# Patient Record
Sex: Male | Born: 1985
Health system: Southern US, Community
[De-identification: ages and names within clinical notes are randomized; demographics above are authoritative.]

## PROBLEM LIST (undated history)

## (undated) DIAGNOSIS — F201 Disorganized schizophrenia: Secondary | ICD-10-CM

## (undated) DIAGNOSIS — F209 Schizophrenia, unspecified: Secondary | ICD-10-CM

## (undated) DIAGNOSIS — F319 Bipolar disorder, unspecified: Secondary | ICD-10-CM

## (undated) HISTORY — DX: Disorganized schizophrenia: F20.1

---

## 2012-09-27 ENCOUNTER — Emergency Department: Payer: Self-pay | Admitting: Emergency Medicine

## 2012-09-30 ENCOUNTER — Emergency Department: Payer: Self-pay | Admitting: Emergency Medicine

## 2012-10-04 ENCOUNTER — Emergency Department: Payer: Self-pay | Admitting: Emergency Medicine

## 2012-10-11 ENCOUNTER — Emergency Department: Payer: Self-pay | Admitting: Emergency Medicine

## 2017-02-09 DIAGNOSIS — F209 Schizophrenia, unspecified: Secondary | ICD-10-CM | POA: Insufficient documentation

## 2017-02-09 DIAGNOSIS — F79 Unspecified intellectual disabilities: Secondary | ICD-10-CM | POA: Insufficient documentation

## 2017-02-13 ENCOUNTER — Ambulatory Visit: Payer: Self-pay | Admitting: Family Medicine

## 2017-02-18 ENCOUNTER — Emergency Department
Admission: EM | Admit: 2017-02-18 | Discharge: 2017-02-18 | Disposition: A | Discharge status: Home / Self Care | Payer: Medicare Other | Attending: Emergency Medicine | Admitting: Emergency Medicine

## 2017-02-18 ENCOUNTER — Encounter: Payer: Self-pay | Admitting: Emergency Medicine

## 2017-02-18 ENCOUNTER — Emergency Department: Payer: Medicare Other

## 2017-02-18 DIAGNOSIS — F209 Schizophrenia, unspecified: Secondary | ICD-10-CM | POA: Diagnosis not present

## 2017-02-18 DIAGNOSIS — R55 Syncope and collapse: Secondary | ICD-10-CM | POA: Insufficient documentation

## 2017-02-18 HISTORY — DX: Schizophrenia, unspecified: F20.9

## 2017-02-18 HISTORY — DX: Bipolar disorder, unspecified: F31.9

## 2017-02-18 LAB — CBC
HEMATOCRIT: 44.1 % (ref 40.0–52.0)
HEMOGLOBIN: 14.7 g/dL (ref 13.0–18.0)
MCH: 29.4 pg (ref 26.0–34.0)
MCHC: 33.3 g/dL (ref 32.0–36.0)
MCV: 88.3 fL (ref 80.0–100.0)
Platelets: 182 10*3/uL (ref 150–440)
RBC: 5 MIL/uL (ref 4.40–5.90)
RDW: 12.3 % (ref 11.5–14.5)
WBC: 6.6 10*3/uL (ref 3.8–10.6)

## 2017-02-18 LAB — BASIC METABOLIC PANEL
Anion gap: 9 (ref 5–15)
BUN: 8 mg/dL (ref 6–20)
CALCIUM: 9.4 mg/dL (ref 8.9–10.3)
CO2: 26 mmol/L (ref 22–32)
CREATININE: 1.03 mg/dL (ref 0.61–1.24)
Chloride: 102 mmol/L (ref 101–111)
GFR calc non Af Amer: >60 mL/min (ref >60)
Glucose, Bld: 113 mg/dL — ABNORMAL HIGH (ref 65–99)
Potassium: 4 mmol/L (ref 3.5–5.1)
SODIUM: 137 mmol/L (ref 135–145)

## 2017-02-18 NOTE — Discharge Instructions (Signed)
Your workup in the Emergency Department today was reassuring.  We did not find any specific abnormalities.  You also told us that you think this might have happened because you were angry, and that might be true because we do not find any abnormal medical issues at this time.  We recommend you drink plenty of fluids, take your regular medications and/or any new ones prescribed today, and follow up with the doctor(s) listed in these documents as recommended.  Return to the Emergency Department if you develop new or worsening symptoms that concern you.

## 2017-02-18 NOTE — ED Notes (Signed)
Attempted to contact pt's legal guardian Thomas Forbes, DSS.  Per voicemail, he is out of the office until 9/10, unable to reach him on his cell phone, receive busy signal.    Office [phone] Cell [phone]

## 2017-02-18 NOTE — ED Notes (Signed)
Patient transported to X-ray 

## 2017-02-18 NOTE — ED Triage Notes (Signed)
Pt in via [facility] from group home due to syncopal episode.  Pt alert upon arrival with some delayed responses to verbal stimuli.  Pt states, "I wasn't feeling well and I passed out."  Pt denies any further complaints.  Pt with low grade fever 99.9 upon arrival.  Cough noted; pt unable to tell me how long cough has been going on.  NAD noted at this time.

## 2017-02-18 NOTE — ED Provider Notes (Addendum)
[facility] Emergency Department Provider Note  ____________________________________________   First MD Initiated Contact with Patient 02/18/17 2045     (approximate)  I have reviewed the triage vital signs and the nursing notes.   HISTORY  Chief Complaint Loss of Consciousness  Level 5 caveat:  history/ROS limited by chronic psychiatric illness.  The patient has a legal guardian.   HPI Thomas Forbes is a 31 y.o. male with chronic psychiatric illness who presents by EMS after possibly passing out or at least being found on the floor.  Initially the patient would not communicate or provide any additional history but his exam was inconsistent; he would not respond but he was tracking people in the room with eyes that were mostly closed, he would respond to painful stimuli, and he would divert his arm when it was raised above his face and dropped.  By the time I saw him he was completely back to baseline.  He states that he remembers passing out, but he states "I think I was just angry about some stuff and did not want to talk."  He denies chest pain, headache, neck pain, shortness of breath, nausea, and vomiting.He states he was angry before but is no longer angry and would like to go home.  He states he thinks this has happened to him before.   Past Medical History:  Diagnosis Date  . Bipolar 1 disorder (HCC)   . Schizophrenia (HCC)     There are no active problems to display for this patient.   History reviewed. No pertinent surgical history.  Prior to Admission medications   Not on File    Allergies Patient has no known allergies.  History reviewed. No pertinent family history.  Social History Social History  Substance Use Topics  . Smoking status: Never Smoker  . Smokeless tobacco: Never Used  . Alcohol use No    Review of Systems Level 5 caveat:  history/ROS limited by chronic psychiatric illness.  The patient has a legal  guardian.  Constitutional: No fever/chills Cardiovascular: Denies chest pain. Respiratory: Denies shortness of breath. Gastrointestinal: No abdominal pain.  No nausea, no vomiting.  No diarrhea.  No constipation. Genitourinary: Negative for dysuria. Musculoskeletal: Negative for neck pain.  Negative for back pain. Neurological: Negative for headaches, focal weakness or numbness.   ____________________________________________   PHYSICAL EXAM:  VITAL SIGNS: ED Triage Vitals  Enc Vitals Group     BP 02/18/17 1927 132/83     Pulse Rate 02/18/17 1927 (!) 111     Resp 02/18/17 1927 (!) 22     Temp 02/18/17 1927 99.9 F (37.7 C)     Temp Source 02/18/17 1927 Oral     SpO2 02/18/17 1927 97 %     Weight 02/18/17 1928 81 kg (178 lb 9.2 oz)     Height 02/18/17 1928 1.727 m ( )     Head Circumference --      Peak Flow --      Pain Score --      Pain Loc --      Pain Edu? --      Excl. in GC? --     Constitutional: Alert and oriented. Well appearing and in no acute distress. Eyes: Conjunctivae are normal.  Head: Atraumatic. Cardiovascular: Normal rate, regular rhythm. Good peripheral circulation. Grossly normal heart sounds. Respiratory: Normal respiratory effort.  No retractions. Lungs CTAB. Gastrointestinal: Soft and nontender. No distention.  Musculoskeletal: No lower extremity tenderness nor edema.  No gross deformities of extremities. Neurologic:  Normal speech and language. No gross focal neurologic deficits are appreciated.  Skin:  Skin is warm, dry and intact. No rash noted. Psychiatric: Mood and affect Seem to be at the patient's baseline based on prior documentation  ____________________________________________   LABS (all labs ordered are listed, but only abnormal results are displayed)  Labs Reviewed  BASIC METABOLIC PANEL - Abnormal; Notable for the following:       Result Value   Glucose, Bld 113 (*)    All other components within normal limits  CBC  CBG  MONITORING, ED   ____________________________________________  EKG  ED ECG REPORT I, Deacon Gadbois, the attending physician, personally viewed and interpreted this ECG.  Date: 02/18/2017 EKG Time: 19:16 Rate: 100 Rhythm: Borderline sinus tachycardia QRS Axis: normal Intervals: normal ST/T Wave abnormalities: Minimal ST elevation in multiple leads most consistent with early repolarization pattern less likely pericarditis Narrative Interpretation: no evidence of acute ischemia  ____________________________________________  RADIOLOGY   Dg Chest 2 View  Result Date: 02/18/2017 CLINICAL DATA:  Fever.  Cough of unknown duration.  Syncope. EXAM: CHEST  2 VIEW COMPARISON:  None. FINDINGS: The cardiomediastinal contours are normal. Mild central bronchitic changes. Pulmonary vasculature is normal. No consolidation, pleural effusion, or pneumothorax. No acute osseous abnormalities are seen. IMPRESSION: Mild central bronchitic changes. No consolidation to suggest pneumonia. Electronically Signed   By: Rubye Oaks M.D.   On: 02/18/2017 20:35   Ct Head Wo Contrast  Result Date: 02/18/2017 CLINICAL DATA:  31 year old male with history of syncope. EXAM: CT HEAD WITHOUT CONTRAST TECHNIQUE: Contiguous axial images were obtained from the base of the skull through the vertex without intravenous contrast. COMPARISON:  None. FINDINGS: Brain: No evidence of acute infarction, hemorrhage, hydrocephalus, extra-axial collection or mass lesion/mass effect. Vascular: No hyperdense vessel or unexpected calcification. Skull: Normal. Negative for fracture or focal lesion. Sinuses/Orbits: Mild multifocal mucosal thickening in the ethmoid sinuses bilaterally. No acute finding. Other: None. IMPRESSION: 1. No acute intracranial abnormalities. The appearance of the brain is normal. Electronically Signed   By: Trudie Reed M.D.   On: 02/18/2017 20:42     ____________________________________________   PROCEDURES  Critical Care performed: No   Procedure(s) performed:   Procedures   ____________________________________________   INITIAL IMPRESSION / ASSESSMENT AND PLAN / ED COURSE  Pertinent labs & imaging results that were available during my care of the patient were reviewed by me and considered in my medical decision making (see chart for details).  The patient initially had some slightly abnormal vital signs, but his vital signs are normal and stable when I evaluated him.  He is awake, alert, and appropriate.  He states he thinks this was all because he was angry, and I can find no acute or emergent medical condition.  Initially he had a very slightly elevated temperature and a very slightly elevated heart rate, but this could all be consistent with a viral syndrome, and he has had a thorough medical workup including labs, CT scan of his head, and chest x-rays, all of which are normal.  He reports no urinary symptoms, and in the absence of any dysuria do not see that it is necessary to check a urinalysis.  I will discharge him for outpatient follow-up.  He wants to go home and I think this is appropriate.   I gave my usual and customary return precautions.        ____________________________________________  FINAL CLINICAL IMPRESSION(S) / ED DIAGNOSES  Final diagnoses:  Syncope, unspecified syncope type     MEDICATIONS GIVEN DURING THIS VISIT:  Medications - No data to display   NEW OUTPATIENT MEDICATIONS STARTED DURING THIS VISIT:  New Prescriptions   No medications on file    Modified Medications   No medications on file    Discontinued Medications   No medications on file     Note:  This document was prepared using Dragon voice recognition software and may include unintentional dictation errors.    Loleta RoseForbach, Antavius Sperbeck, MD 02/18/17 2126    Loleta RoseForbach, Abrea Henle, MD 02/18/17 2128

## 2017-02-18 NOTE — ED Notes (Signed)
Pt unable to sign for discharge due to guardianship; see previous note.  Pt transported back to [facility] at this time via group home employee.

## 2017-02-18 NOTE — ED Notes (Signed)
This RN spoke with DSS after hours in regards to discharging pt back to [facility].  Cordelia PenAvi Williams, DSS is aware and states that she will relay information back to his legal guardian.

## 2017-02-18 NOTE — ED Notes (Addendum)
Report called to care taker of [facility], Laurian BrimChris Jennings.  Group Home agrees to provide transportation back to facility, states they will be here in approximately 20 minutes.  Pt will remain in room until transportation arrives.

## 2017-02-18 NOTE — ED Notes (Signed)
Patient transported to CT 

## 2017-03-26 ENCOUNTER — Emergency Department: Payer: Medicare Other

## 2017-03-26 ENCOUNTER — Emergency Department
Admission: EM | Admit: 2017-03-26 | Discharge: 2017-03-26 | Disposition: A | Discharge status: Home / Self Care | Payer: Medicare Other | Attending: Emergency Medicine | Admitting: Emergency Medicine

## 2017-03-26 DIAGNOSIS — F209 Schizophrenia, unspecified: Secondary | ICD-10-CM | POA: Diagnosis not present

## 2017-03-26 DIAGNOSIS — F319 Bipolar disorder, unspecified: Secondary | ICD-10-CM | POA: Insufficient documentation

## 2017-03-26 DIAGNOSIS — R4182 Altered mental status, unspecified: Secondary | ICD-10-CM | POA: Diagnosis present

## 2017-03-26 DIAGNOSIS — R42 Dizziness and giddiness: Secondary | ICD-10-CM | POA: Diagnosis not present

## 2017-03-26 LAB — URINALYSIS, COMPLETE (UACMP) WITH MICROSCOPIC
Bacteria, UA: NONE SEEN
Bilirubin Urine: NEGATIVE
GLUCOSE, UA: NEGATIVE mg/dL
HGB URINE DIPSTICK: NEGATIVE
KETONES UR: NEGATIVE mg/dL
LEUKOCYTES UA: NEGATIVE
Nitrite: NEGATIVE
PROTEIN: NEGATIVE mg/dL
RBC / HPF: NONE SEEN RBC/hpf (ref 0–5)
Specific Gravity, Urine: 1.005 (ref 1.005–1.030)
Squamous Epithelial / LPF: NONE SEEN
WBC UA: NONE SEEN WBC/hpf (ref 0–5)
pH: 7 (ref 5.0–8.0)

## 2017-03-26 LAB — BASIC METABOLIC PANEL
ANION GAP: 10 (ref 5–15)
BUN: 8 mg/dL (ref 6–20)
CALCIUM: 9.8 mg/dL (ref 8.9–10.3)
CHLORIDE: 106 mmol/L (ref 101–111)
CO2: 25 mmol/L (ref 22–32)
CREATININE: 0.97 mg/dL (ref 0.61–1.24)
GFR calc non Af Amer: >60 mL/min (ref >60)
Glucose, Bld: 118 mg/dL — ABNORMAL HIGH (ref 65–99)
Potassium: 3.8 mmol/L (ref 3.5–5.1)
SODIUM: 141 mmol/L (ref 135–145)

## 2017-03-26 LAB — URINE DRUG SCREEN, QUALITATIVE (ARMC ONLY)
Amphetamines, Ur Screen: NOT DETECTED
BARBITURATES, UR SCREEN: NOT DETECTED
BENZODIAZEPINE, UR SCRN: NOT DETECTED
CANNABINOID 50 NG, UR ~~LOC~~: NOT DETECTED
Cocaine Metabolite,Ur ~~LOC~~: NOT DETECTED
MDMA (Ecstasy)Ur Screen: NOT DETECTED
Methadone Scn, Ur: NOT DETECTED
OPIATE, UR SCREEN: NOT DETECTED
PHENCYCLIDINE (PCP) UR S: NOT DETECTED
Tricyclic, Ur Screen: NOT DETECTED

## 2017-03-26 LAB — CBC
HCT: 43.4 % (ref 40.0–52.0)
HEMOGLOBIN: 14.1 g/dL (ref 13.0–18.0)
MCH: 28.9 pg (ref 26.0–34.0)
MCHC: 32.5 g/dL (ref 32.0–36.0)
MCV: 88.8 fL (ref 80.0–100.0)
PLATELETS: 199 10*3/uL (ref 150–440)
RBC: 4.89 MIL/uL (ref 4.40–5.90)
RDW: 12.5 % (ref 11.5–14.5)
WBC: 4.2 10*3/uL (ref 3.8–10.6)

## 2017-03-26 LAB — GLUCOSE, CAPILLARY: GLUCOSE-CAPILLARY: 116 mg/dL — AB (ref 65–99)

## 2017-03-26 LAB — SALICYLATE LEVEL: Salicylate Lvl: <7 mg/dL (ref 2.8–30.0)

## 2017-03-26 LAB — ETHANOL

## 2017-03-26 LAB — ACETAMINOPHEN LEVEL: Acetaminophen (Tylenol), Serum: <10 ug/mL — ABNORMAL LOW (ref 10–30)

## 2017-03-26 MED ORDER — DIPHENHYDRAMINE HCL 25 MG PO CAPS
25.0000 mg | ORAL_CAPSULE | Freq: Once | ORAL | Status: AC
  Administered 2017-03-26: 25 mg via ORAL
  Filled 2017-03-26: qty 1

## 2017-03-26 MED ORDER — ONDANSETRON 4 MG PO TBDP
4.0000 mg | ORAL_TABLET | Freq: Once | ORAL | Status: AC
  Administered 2017-03-26: 4 mg via ORAL
  Filled 2017-03-26: qty 1

## 2017-03-26 NOTE — ED Notes (Signed)
Tora Perches Emergency planning/management officer) of [facility] on [address] notified of discharge. Pending transport.

## 2017-03-26 NOTE — ED Notes (Signed)
Legal guardian is Warehouse manager, [place]; Pt is in group home. Pt states he has not had any drugs or alcohol today, but had a cigarette. Caregiver states that he has never smoked before, but he states he smoked at his previous residence (4 or 5 years ago). Caregiver states that he stated he told her he was unable to open the car door. Pt states that he was feeling dizzy and did not feel he could open the door but he is no longer dizzy.

## 2017-03-26 NOTE — ED Provider Notes (Signed)
[facility] Emergency Department Provider Note  ____________________________________________   First MD Initiated Contact with Patient 03/26/17 1718     (approximate)  I have reviewed the triage vital signs and the nursing notes.   HISTORY  Chief Complaint Weakness   HPI Deray Pyon is a 31 y.o. male with a history of bipolar disorder as well as schizophrenia on monthly Haldol injections was presenting to the emergency department today with an episode of altered mentation. He is with his caretaker who knows him well. She says that this afternoon here. Of time where he was uncooperative and refused to walk and open the door of their car. She said that this is very unusual for him. She also notes that he is blinking his eyes more than usual. The patient was also feeling slightly lightheaded and with a frontal headache which haven't all now resolved. He has no complete at this time. The caretaker is concerned about him using drugs at his day program. The patient admits to smoking cigarettes but denies any drug use.   Past Medical History:  Diagnosis Date  . Bipolar 1 disorder (HCC)   . Schizophrenia (HCC)     There are no active problems to display for this patient.   History reviewed. No pertinent surgical history.  Prior to Admission medications   Not on File    Allergies Patient has no known allergies.  No family history on file.  Social History Social History  Substance Use Topics  . Smoking status: Never Smoker  . Smokeless tobacco: Never Used  . Alcohol use No    Review of Systems  Constitutional: No fever/chills Eyes: No visual changes. ENT: No sore throat. Cardiovascular: Denies chest pain. Respiratory: Denies shortness of breath. Gastrointestinal: No abdominal pain.  No nausea, no vomiting.  No diarrhea.  No constipation. Genitourinary: Negative for dysuria. Musculoskeletal: Negative for back pain. Skin: Negative for  rash. Neurological: Negative for focal weakness or numbness.   ____________________________________________   PHYSICAL EXAM:  VITAL SIGNS: ED Triage Vitals  Enc Vitals Group     BP 03/26/17 1536 107/68     Pulse Rate 03/26/17 1536 (!) 106     Resp 03/26/17 1536 20     Temp 03/26/17 1536 99 F (37.2 C)     Temp Source 03/26/17 1536 Oral     SpO2 03/26/17 1536 98 %     Weight 03/26/17 1537 180 lb (81.6 kg)     Height --      Head Circumference --      Peak Flow --      Pain Score --      Pain Loc --      Pain Edu? --      Excl. in GC? --     Constitutional: Alert and oriented. Well appearing and in no acute distress. Eyes: Conjunctivae are normal. intermittent blinking of the eyes. Head: Atraumatic. Nose: No congestion/rhinnorhea. Mouth/Throat: Mucous membranes are moist.  Neck: No stridor.   Cardiovascular: Normal rate, regular rhythm. Grossly normal heart sounds.  heart rate 83 bpm in the room. Respiratory: Normal respiratory effort.  No retractions. Lungs CTAB. Gastrointestinal: Soft and nontender. No distention.  Musculoskeletal: No lower extremity tenderness nor edema.  No joint effusions. Neurologic:  Normal speech and language. No gross focal neurologic deficits are appreciated. Skin:  Skin is warm, dry and intact. No rash noted. Psychiatric: Mood and affect are normal. Speech and behavior are normal.  ____________________________________________   LABS (all  labs ordered are listed, but only abnormal results are displayed)  Labs Reviewed  BASIC METABOLIC PANEL - Abnormal; Notable for the following:       Result Value   Glucose, Bld 118 (*)    All other components within normal limits  URINALYSIS, COMPLETE (UACMP) WITH MICROSCOPIC - Abnormal; Notable for the following:    Color, Urine STRAW (*)    APPearance CLEAR (*)    All other components within normal limits  GLUCOSE, CAPILLARY - Abnormal; Notable for the following:    Glucose-Capillary 116 (*)     All other components within normal limits  ACETAMINOPHEN LEVEL - Abnormal; Notable for the following:    Acetaminophen (Tylenol), Serum <10 (*)    All other components within normal limits  CBC  URINE DRUG SCREEN, QUALITATIVE ([facility] ONLY)  ETHANOL  SALICYLATE LEVEL  CBG MONITORING, ED   ____________________________________________  EKG  ED ECG REPORT I, Arelia Longest, the attending physician, personally viewed and interpreted this ECG.   Date: 03/26/2017  EKG Time: 1541  Rate: 105  Rhythm: sinus tachycardia  Axis: normal  Intervals:none  ST&T Change: no ST segment elevation or depression. No abnormal T-wave inversion.  ____________________________________________  RADIOLOGY  no acute finding on head CT. ____________________________________________   PROCEDURES  Procedure(s) performed:   Procedures  Critical Care performed:   ____________________________________________   INITIAL IMPRESSION / ASSESSMENT AND PLAN / ED COURSE  Pertinent labs & imaging results that were available during my care of the patient were reviewed by me and considered in my medical decision making (see chart for details).  Differential diagnosis includes, but is not limited to, alcohol, illicit or prescription medications, or other toxic ingestion; intracranial pathology such as stroke or intracerebral hemorrhage; fever or infectious causes including sepsis; hypoxemia and/or hypercarbia; uremia; trauma; endocrine related disorders such as diabetes, hypoglycemia, and thyroid-related diseases; hypertensive encephalopathy; etc.  As part of my medical decision making, I reviewed the following data within the electronic MEDICAL RECORD NUMBER Notes from prior ED visits  possible dystonic reaction to Haldol. Benadryl ordered. We will reassess. Patient has an appointment with his psychiatrist this coming Wednesday.    ----------------------------------------- 7:14 PM on  03/26/2017 -----------------------------------------  Patient with very reassuring lab results. No complaints at this time. Still with flickering of his eyelids. Unclear cause of this. However, the patient has no myoclonic jerking. Otherwise nonfocal neurologic exam. Examined for clonus at this time which is negative. No hyperreflexia. Patient to follow-up with his psychiatrist this Wednesday for possible medication changes. Patient to be discharged with group home are presented.  ____________________________________________   FINAL CLINICAL IMPRESSION(S) / ED DIAGNOSES  altered mental status, resolved.    NEW MEDICATIONS STARTED DURING THIS VISIT:  New Prescriptions   No medications on file     Note:  This document was prepared using Dragon voice recognition software and may include unintentional dictation errors.     Myrna Blazer, MD 03/26/17 [phone]

## 2017-03-26 NOTE — ED Notes (Signed)
Questionable substance use at day program. Pt denies. Caregiver suspicious if patient used mariajuana while there. No guardian listed, pt cannot give phone number of legal guardian. Listed is group home owner number, pt with staff of group home.

## 2017-03-26 NOTE — ED Notes (Signed)
Left VM with Billy Fischer (legal guardian) to return phone call for discharge information.

## 2017-03-26 NOTE — ED Triage Notes (Signed)
Pt arrives to ER via POV c/o episode of fatigue, weakness and "shakiness". Brought in with caregiver. Pt alert and oriented X4, active, cooperative, pt in NAD. RR even and unlabored, color WNL.

## 2017-03-26 NOTE — ED Notes (Addendum)
Pt. And caregiver from [facility] Verbalize understanding of d/c instructions and follow-up. Message left for guardian. VS stable and pain controlled per pt.  Pt. In NAD at time of d/c and denies further concerns regarding this visit. Pt. Stable at the time of departure from the unit, departing unit by the safest and most appropriate manner per that pt condition and limitations with all belongings accounted for. Pt advised to return to the ED at any time for emergent concerns, or for new/worsening symptoms.

## 2017-05-10 ENCOUNTER — Emergency Department
Admission: EM | Admit: 2017-05-10 | Discharge: 2017-05-11 | Disposition: A | Discharge status: Home / Self Care | Payer: Medicare Other | Attending: Emergency Medicine | Admitting: Emergency Medicine

## 2017-05-10 ENCOUNTER — Other Ambulatory Visit: Payer: Self-pay

## 2017-05-10 DIAGNOSIS — F209 Schizophrenia, unspecified: Secondary | ICD-10-CM | POA: Diagnosis not present

## 2017-05-10 DIAGNOSIS — R4689 Other symptoms and signs involving appearance and behavior: Secondary | ICD-10-CM | POA: Diagnosis present

## 2017-05-10 LAB — URINE DRUG SCREEN, QUALITATIVE (ARMC ONLY)
Amphetamines, Ur Screen: NOT DETECTED
BENZODIAZEPINE, UR SCRN: NOT DETECTED
Barbiturates, Ur Screen: NOT DETECTED
CANNABINOID 50 NG, UR ~~LOC~~: NOT DETECTED
Cocaine Metabolite,Ur ~~LOC~~: NOT DETECTED
MDMA (ECSTASY) UR SCREEN: NOT DETECTED
Methadone Scn, Ur: NOT DETECTED
OPIATE, UR SCREEN: NOT DETECTED
PHENCYCLIDINE (PCP) UR S: NOT DETECTED
Tricyclic, Ur Screen: NOT DETECTED

## 2017-05-10 LAB — CBC
HCT: 45.5 % (ref 40.0–52.0)
Hemoglobin: 15 g/dL (ref 13.0–18.0)
MCH: 28.6 pg (ref 26.0–34.0)
MCHC: 32.9 g/dL (ref 32.0–36.0)
MCV: 87.1 fL (ref 80.0–100.0)
PLATELETS: 212 10*3/uL (ref 150–440)
RBC: 5.22 MIL/uL (ref 4.40–5.90)
RDW: 12.4 % (ref 11.5–14.5)
WBC: 5.2 10*3/uL (ref 3.8–10.6)

## 2017-05-10 LAB — SALICYLATE LEVEL: Salicylate Lvl: <7 mg/dL (ref 2.8–30.0)

## 2017-05-10 LAB — COMPREHENSIVE METABOLIC PANEL
ALK PHOS: 66 U/L (ref 38–126)
ALT: 28 U/L (ref 17–63)
AST: 23 U/L (ref 15–41)
Albumin: 4.4 g/dL (ref 3.5–5.0)
Anion gap: 7 (ref 5–15)
BILIRUBIN TOTAL: 0.6 mg/dL (ref 0.3–1.2)
BUN: 10 mg/dL (ref 6–20)
CO2: 24 mmol/L (ref 22–32)
Calcium: 9.5 mg/dL (ref 8.9–10.3)
Chloride: 106 mmol/L (ref 101–111)
Creatinine, Ser: 0.76 mg/dL (ref 0.61–1.24)
GFR calc Af Amer: >60 mL/min (ref >60)
Glucose, Bld: 122 mg/dL — ABNORMAL HIGH (ref 65–99)
Potassium: 3.8 mmol/L (ref 3.5–5.1)
Sodium: 137 mmol/L (ref 135–145)
TOTAL PROTEIN: 7.3 g/dL (ref 6.5–8.1)

## 2017-05-10 LAB — ETHANOL

## 2017-05-10 LAB — ACETAMINOPHEN LEVEL: Acetaminophen (Tylenol), Serum: <10 ug/mL — ABNORMAL LOW (ref 10–30)

## 2017-05-10 MED ORDER — BENZTROPINE MESYLATE 1 MG PO TABS
2.0000 mg | ORAL_TABLET | Freq: Every day | ORAL | Status: DC
  Administered 2017-05-10: 2 mg via ORAL

## 2017-05-10 MED ORDER — RISPERIDONE 1 MG PO TABS: 1.0000 mg | ORAL_TABLET | Freq: Every day | ORAL | Status: DC

## 2017-05-10 MED ORDER — LORAZEPAM 1 MG PO TABS: 1.0000 mg | ORAL_TABLET | Freq: Every morning | ORAL | Status: DC

## 2017-05-10 MED ORDER — RISPERIDONE 3 MG PO TABS
4.0000 mg | ORAL_TABLET | Freq: Once | ORAL | Status: AC
  Administered 2017-05-10: 4 mg via ORAL
  Filled 2017-05-10: qty 1

## 2017-05-10 MED ORDER — BENZTROPINE MESYLATE 1 MG PO TABS
ORAL_TABLET | ORAL | Status: AC
  Filled 2017-05-10: qty 2

## 2017-05-10 MED ORDER — BENZTROPINE MESYLATE 1 MG/ML IJ SOLN
2.0000 mg | Freq: Every day | INTRAMUSCULAR | Status: DC
  Filled 2017-05-10: qty 2

## 2017-05-10 NOTE — ED Notes (Signed)
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
## 2017-05-10 NOTE — ED Notes (Signed)
Pt. To [facility] from ED ambulatory without difficulty, to room  . Report from RN. Pt. Is alert and oriented, warm and dry in no distress. Pt. Denies SI, HI, and AVH. Pt. Calm and cooperative. Pt. Made aware of security cameras and Q15 minute rounds. Pt. Encouraged to let Nursing staff know of any concerns or needs.   
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
## 2017-05-11 NOTE — ED Notes (Signed)
[phone] Conan BowensKenneth Bragg patient's guardian. He would like a call back from EDP.

## 2017-05-11 NOTE — ED Notes (Signed)
Group home made aware of patient discharge. Manager of group home states will be here in 30 mins for pick up.

## 2017-05-11 NOTE — ED Notes (Signed)
Patient alert and oriented. Patient denies SI/HI/AVH and pain. Patient aware of discharge to group home and is accepting.

## 2017-05-11 NOTE — ED Notes (Signed)
Pt discharged to group home. D/C instructions reviewed with patient and given to group home representative. All belongings returned to patient. Pt denies SI/HI and AVH.

## 2017-05-11 NOTE — ED Notes (Signed)
SOC recommends discharge. 

## 2017-05-11 NOTE — ED Notes (Signed)
Paperwork received stating Conan BowensKenneth Bragg is Pt's guardian. Placing faxed paperwork in paper chart.

## 2017-05-11 NOTE — ED Notes (Signed)
Report given to SOC 

## 2017-05-11 NOTE — ED Notes (Addendum)
Guardian Thomas Forbes(Thomas Forbes)called wanting fax number to fax over guardianship paperwork. Fax number gave.

## 2017-10-18 ENCOUNTER — Other Ambulatory Visit: Payer: Self-pay

## 2017-10-18 ENCOUNTER — Ambulatory Visit (INDEPENDENT_AMBULATORY_CARE_PROVIDER_SITE_OTHER): Payer: Medicare Other | Admitting: Nurse Practitioner

## 2017-10-18 ENCOUNTER — Encounter: Payer: Self-pay | Admitting: Nurse Practitioner

## 2017-10-18 VITALS — BP 115/52 | HR 82 | Temp 98.2°F | Ht 66.5 in | Wt 200.0 lb

## 2017-10-18 DIAGNOSIS — Z7689 Persons encountering health services in other specified circumstances: Secondary | ICD-10-CM | POA: Diagnosis not present

## 2017-10-18 DIAGNOSIS — Z6831 Body mass index (BMI) 31.0-31.9, adult: Secondary | ICD-10-CM

## 2017-10-18 DIAGNOSIS — E661 Drug-induced obesity: Secondary | ICD-10-CM

## 2017-10-18 DIAGNOSIS — F79 Unspecified intellectual disabilities: Secondary | ICD-10-CM | POA: Diagnosis not present

## 2017-10-18 DIAGNOSIS — F201 Disorganized schizophrenia: Secondary | ICD-10-CM

## 2017-10-18 NOTE — Progress Notes (Signed)
Subjective:    Patient ID: Thomas Forbes, male    DOB: 16-Feb-1986, 32 y.o.   MRN: [id number]  Thomas Forbes is a 32 y.o. male presenting on 10/18/2017 for Establish Care   HPI Establish Care New Provider Pt last seen by PCP several months ago.  Obtain records from Dr. Martie Round office.  Care is being transitioned 2/2 request from guardian as they could not get records from his prior PCP.  Guardianship is held by [facility] Conan Bowens, M.Ed).  Pt is resident of group home x 8 years (started in 2011).  Home is lead and managed by Birder Robson.  His care assistant is Tora Perches, who is accompanying patient today.  Jay Schlichter is also a designated party for release of information.    Bipolar 1 Disorder/Schizophrenia: Pt is managed by Dr. Janeece Riggers in [facility] with [facility].  He has had very few behavioral outbursts in recent weeks without need for prn ativan or haldol administration on last 30 day MAR record.  Pt admits he is currently functioning well on medications as prescribed.  Obesity - Weight gain over the winter with more sedentary lifestyle.  Pt and caregiver admit pt will become much more active in near future with more outdoor activity in summer.  Pt is not preparer of his own meals.  Usually has good appetite.  Past Medical History:  Diagnosis Date  . Bipolar 1 disorder (HCC)   . Chronic disorganized schizophrenia (HCC)   . Schizophrenia (HCC)    No past surgical history on file. Social History   Socioeconomic History  . Marital status: Single    Spouse name: Not on file  . Number of children: Not on file  . Years of education: Not on file  . Highest education level: 10th grade  Occupational History  . Not on file  Social Needs  . Financial resource strain: Not on file  . Food insecurity:    Worry: Not on file    Inability: Not on file  . Transportation needs:    Medical: Not on file    Non-medical: Not on file  Tobacco Use   . Smoking status: Former Smoker    Types: Cigarettes    Last attempt to quit: 08/18/2017    Years since quitting: 0.2  . Smokeless tobacco: Never Used  . Tobacco comment: intermittent cigarettes - smoked from friends  Substance and Sexual Activity  . Alcohol use: No  . Drug use: No  . Sexual activity: Not on file  Lifestyle  . Physical activity:    Days per week: Not on file    Minutes per session: Not on file  . Stress: Not on file  Relationships  . Social connections:    Talks on phone: Not on file    Gets together: Not on file    Attends religious service: Not on file    Active member of club or organization: Not on file    Attends meetings of clubs or organizations: Not on file    Relationship status: Not on file  . Intimate partner violence:    Fear of current or ex partner: Not on file    Emotionally abused: Not on file    Physically abused: Not on file    Forced sexual activity: Not on file  Other Topics Concern  . Not on file  Social History Narrative  . Not on file   No family history on file. Current Outpatient Medications on File Prior  to Visit  Medication Sig  . benztropine (COGENTIN) 2 MG tablet Take 2 mg by mouth at bedtime.  . Cholecalciferol (VITAMIN D) 2000 units tablet Take 2,000 Units by mouth daily.  . haloperidol (HALDOL) 2 MG tablet Take 1 mg by mouth daily as needed for agitation.  . haloperidol decanoate (HALDOL DECANOATE) 100 MG/ML injection 250 mg every 28 (twenty-eight) days.   Marland Kitchen LORazepam (ATIVAN) 1 MG tablet Take 1 mg by mouth daily as needed for anxiety.  . risperiDONE (RISPERDAL) 2 MG tablet Take 2 mg by mouth daily.  . risperidone (RISPERDAL) 4 MG tablet Take 4 mg by mouth at bedtime.   No current facility-administered medications on file prior to visit.     Review of Systems  Constitutional: Negative.  Negative for unexpected weight change.  HENT: Negative.   Eyes: Negative.   Respiratory: Negative.   Cardiovascular: Negative.     Gastrointestinal: Negative.   Endocrine: Negative.   Genitourinary: Negative.   Musculoskeletal: Negative.   Skin: Negative.   Allergic/Immunologic: Negative.   Neurological: Negative.   Hematological: Negative.   Psychiatric/Behavioral: Negative.  Negative for behavioral problems, decreased concentration, dysphoric mood and self-injury.       Objective:    BP (!) 115/52 (BP Location: Right Arm, Patient Position: Sitting, Cuff Size: Normal)   Pulse 82   Temp 98.2 F (36.8 C) (Oral)   Ht 5' 6.5" (1.689 m)   Wt 200 lb (90.7 kg)   BMI 31.80 kg/m    Wt Readings from Last 3 Encounters:  10/18/17 200 lb (90.7 kg)  05/10/17 180 lb (81.6 kg)  03/26/17 180 lb (81.6 kg)    Physical Exam  Constitutional: He is oriented to person, place, and time. He appears well-developed and well-nourished. No distress.  HENT:  Head: Normocephalic and atraumatic.  Right Ear: External ear normal.  Left Ear: External ear normal.  Nose: Nose normal.  Mouth/Throat: Oropharynx is clear and moist.  Eyes: Pupils are equal, round, and reactive to light. Conjunctivae are normal.  Neck: Normal range of motion. Neck supple. No JVD present. No tracheal deviation present. No thyromegaly present.  Cardiovascular: Normal rate, regular rhythm, normal heart sounds and intact distal pulses. Exam reveals no gallop and no friction rub.  No murmur heard. Pulmonary/Chest: Effort normal and breath sounds normal. No respiratory distress.  Abdominal: Soft. Bowel sounds are normal. He exhibits no distension. There is no tenderness.  Musculoskeletal: Normal range of motion.  Lymphadenopathy:    He has no cervical adenopathy.  Neurological: He is alert and oriented to person, place, and time. No cranial nerve deficit.  Skin: Skin is warm and dry. Capillary refill takes less than 2 seconds.  Psychiatric: He has a normal mood and affect. Thought content normal. His affect is not inappropriate. He is hyperactive (fidgeting  with hands) and withdrawn. He is not agitated and not aggressive. Cognition and memory are not impaired. He expresses impulsivity and inappropriate judgment (unable to make own decisions for medical care). He is noncommunicative (Pt is not highly communicative, but shares most required information.  Slow to trust new people.).  Pt is well groomed with clean clothing and good personal hygiene.  Nursing note and vitals reviewed.  Results for orders placed or performed during the hospital encounter of 05/10/17  Comprehensive metabolic panel  Result Value Ref Range   Sodium 137 135 - 145 mmol/L   Potassium 3.8 3.5 - 5.1 mmol/L   Chloride 106 101 - 111 mmol/L   CO2  24 22 - 32 mmol/L   Glucose, Bld 122 (H) 65 - 99 mg/dL   BUN 10 6 - 20 mg/dL   Creatinine, Ser 8.46 0.61 - 1.24 mg/dL   Calcium 9.5 8.9 - 96.2 mg/dL   Total Protein 7.3 6.5 - 8.1 g/dL   Albumin 4.4 3.5 - 5.0 g/dL   AST 23 15 - 41 U/L   ALT 28 17 - 63 U/L   Alkaline Phosphatase 66 38 - 126 U/L   Total Bilirubin 0.6 0.3 - 1.2 mg/dL   GFR calc non Af Amer >60 >60 mL/min   GFR calc Af Amer >60 >60 mL/min   Anion gap 7 5 - 15  Ethanol  Result Value Ref Range   Alcohol, Ethyl (B) <10 <10 mg/dL  Salicylate level  Result Value Ref Range   Salicylate Lvl <7.0 2.8 - 30.0 mg/dL  Acetaminophen level  Result Value Ref Range   Acetaminophen (Tylenol), Serum <10 (L) 10 - 30 ug/mL  cbc  Result Value Ref Range   WBC 5.2 3.8 - 10.6 K/uL   RBC 5.22 4.40 - 5.90 MIL/uL   Hemoglobin 15.0 13.0 - 18.0 g/dL   HCT 95.2 84.1 - 32.4 %   MCV 87.1 80.0 - 100.0 fL   MCH 28.6 26.0 - 34.0 pg   MCHC 32.9 32.0 - 36.0 g/dL   RDW 40.1 02.7 - 25.3 %   Platelets 212 150 - 440 K/uL  Urine Drug Screen, Qualitative  Result Value Ref Range   Tricyclic, Ur Screen NONE DETECTED NONE DETECTED   Amphetamines, Ur Screen NONE DETECTED NONE DETECTED   MDMA (Ecstasy)Ur Screen NONE DETECTED NONE DETECTED   Cocaine Metabolite,Ur [address] NONE DETECTED NONE DETECTED    Opiate, Ur Screen NONE DETECTED NONE DETECTED   Phencyclidine (PCP) Ur S NONE DETECTED NONE DETECTED   Cannabinoid 50 Ng, Ur [place] NONE DETECTED NONE DETECTED   Barbiturates, Ur Screen NONE DETECTED NONE DETECTED   Benzodiazepine, Ur Scrn NONE DETECTED NONE DETECTED   Methadone Scn, Ur NONE DETECTED NONE DETECTED      Assessment & Plan:   Problem List Items Addressed This Visit      Other   Intellectual disability Pt with decreased ability to learn.  No high school diploma, but completed special education certificate.  He is a ward of the state with guardian in [place] DSS.  Continue care in group home.  FL2 up to date currently and is due for review at annual physical.    Schizophrenia (HCC)  Currently stable and managed by Dr. Janeece Riggers in [place].  Continue followup as scheduled.    Class 1 drug-induced obesity without serious comorbidity with body mass index (BMI) of 31.0 to 31.9 in adult  -  Primary Likely psychotropic medications are contributing to weight gain.  Pt has fluctuations of weight with increased sedentary lifestyle during winter.  Eats regular diet.   Plan: encouraged increasing physical activity and continuing activity through winter as able.    Other Visit Diagnoses    Encounter to establish care    Previous PCP was at Dr. Glenis Smoker.  Records will be requested.  Past medical, family, and surgical history reviewed w/ pt.        No orders of the defined types were placed in this encounter.    Follow up plan: Return in about 6 months (around 04/20/2018) for annual physical and FL2 renewal.  Wilhelmina Mcardle, DNP, AGPCNP-BC Adult Gerontology Primary Care Nurse Practitioner [facility]  [facility] 11/05/2017, 10:07 PM

## 2017-10-18 NOTE — Patient Instructions (Addendum)
Thomas Forbes,   Thank you for coming in to clinic today.  1. Keep your regular followup with Dr. Janeece Riggers  2. Let us know if you need [place] before November.  Please schedule a follow-up appointment with Wilhelmina Mcardle, AGNP. Return in about 6 months (around 04/20/2018) for annual physical and FL2 renewal.  If you have any other questions or concerns, please feel free to call the clinic or send a message through MyChart. You may also schedule an earlier appointment if necessary.  You will receive a survey after today's visit either digitally by e-mail or paper by [employer]. Your experiences and feedback matter to [place].  Please respond so we know how we are doing as we provide care for you.   Wilhelmina Mcardle, DNP, AGNP-BC Adult Gerontology Nurse Practitioner [facility], [facility]

## 2017-11-05 ENCOUNTER — Encounter: Payer: Self-pay | Admitting: Nurse Practitioner

## 2017-11-05 DIAGNOSIS — E661 Drug-induced obesity: Secondary | ICD-10-CM | POA: Insufficient documentation

## 2017-11-05 DIAGNOSIS — Z6831 Body mass index (BMI) 31.0-31.9, adult: Secondary | ICD-10-CM

## 2017-11-13 ENCOUNTER — Encounter (INDEPENDENT_AMBULATORY_CARE_PROVIDER_SITE_OTHER): Payer: Self-pay

## 2018-04-15 ENCOUNTER — Other Ambulatory Visit: Payer: Medicare Other

## 2018-04-18 ENCOUNTER — Encounter: Payer: Medicare Other | Admitting: Nurse Practitioner

## 2018-04-19 ENCOUNTER — Encounter: Payer: Medicare Other | Admitting: Nurse Practitioner

## 2018-05-21 ENCOUNTER — Ambulatory Visit (INDEPENDENT_AMBULATORY_CARE_PROVIDER_SITE_OTHER): Payer: Medicare Other | Admitting: Nurse Practitioner

## 2018-05-21 ENCOUNTER — Ambulatory Visit: Payer: Medicare Other | Admitting: Nurse Practitioner

## 2018-05-21 ENCOUNTER — Other Ambulatory Visit: Payer: Self-pay

## 2018-05-21 ENCOUNTER — Encounter: Payer: Self-pay | Admitting: Nurse Practitioner

## 2018-05-21 VITALS — BP 105/56 | HR 84 | Temp 98.4°F | Ht 66.5 in | Wt 208.2 lb

## 2018-05-21 DIAGNOSIS — Z79899 Other long term (current) drug therapy: Secondary | ICD-10-CM

## 2018-05-21 DIAGNOSIS — R635 Abnormal weight gain: Secondary | ICD-10-CM

## 2018-05-21 DIAGNOSIS — Z130 Encounter for screening for diseases of the blood and blood-forming organs and certain disorders involving the immune mechanism: Secondary | ICD-10-CM

## 2018-05-21 DIAGNOSIS — Z23 Encounter for immunization: Secondary | ICD-10-CM

## 2018-05-21 DIAGNOSIS — Z131 Encounter for screening for diabetes mellitus: Secondary | ICD-10-CM | POA: Diagnosis not present

## 2018-05-21 NOTE — Patient Instructions (Addendum)
Thomas Forbes,   Thank you for coming in to clinic today.  1. Work to cut back some of your desserts and avoid overeating to keep your weight near a healthy range.  2. You will be due for FASTING BLOOD WORK.  This means you should eat no food or drink after midnight.  Drink only water or coffee without cream/sugar on the morning of your lab visit. - Please go ahead and schedule a "Lab Only" visit in the morning at the clinic for lab draw in the next 7 days. - Your results will be available about 2-3 days after blood draw.  If you have set up a MyChart account, you can can log in to MyChart online to view your results and a brief explanation. Also, we can discuss your results together at your next office visit if you would like.  3. Verify you have had a flu shot.  If you have not gotten one this year.  You are due to get that when you come for labs.  If you have gotten it, please bring us your record.  Please schedule a follow-up appointment with Wilhelmina McardleLauren Baeleigh Devincent, AGNP. Return in about 1 year (around 05/22/2019) for annual physical.    If you have any other questions or concerns, please feel free to call the clinic or send a message through MyChart. You may also schedule an earlier appointment if necessary.  You will receive a survey after today's visit either digitally by e-mail or paper by [employer] mail. Your experiences and feedback matter to us.  Please respond so we know how we are doing as we provide care for you.   Wilhelmina McardleLauren Ivelis Norgard, DNP, AGNP-BC Adult Gerontology Nurse Practitioner [facility]

## 2018-05-21 NOTE — Progress Notes (Addendum)
Subjective:    Patient ID: Thomas Forbes, male    DOB: 02-May-1986, 32 y.o.   MRN: [id number]  Thomas Forbes is a 32 y.o. male presenting on 05/21/2018 for Annual Exam   HPI Yearly Medicare Physical Exam Patient has been feeling well.  They have no acute concerns today. Sleeps 8-9 hours per night uninterrupted. - Patient continues to reside in a group home.  FL2 has been completed since his last physical exam (November 2019) and unsure of which physician completed this. - Continues to get along regularly with housemates and generally has not had any behavioral outbursts.  Takes haldol seldomly for these outbursts.  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  Not as active during winter.  Is going to the park for walking.  Diet: generally healthy, no control over choices except. Seatbelt: always Sunscreen: not usually HIV: neg in past Optometry: not regularly Dentistry: at [employer] and [employer] regularly  VACCINES: Tetanus: due - agrees to get today Influenza: was given to him at [facility] - saw them 1 month ago per patient.  He was not able to verify this with any paperwork from his folder.  His caregiver today was also unable to verify this today, but will check with the other home caregivers.  Social History   Tobacco Use  . Smoking status: Former Smoker    Types: Cigarettes    Last attempt to quit: 08/18/2017    Years since quitting: 0.7  . Smokeless tobacco: Never Used  . Tobacco comment: intermittent cigarettes - smoked from friends  Substance Use Topics  . Alcohol use: No  . Drug use: No    Review of Systems Per HPI unless specifically indicated above     Objective:    BP (!) 105/56 (BP Location: Right Arm, Patient Position: Sitting, Cuff Size: Large)   Pulse 84   Temp 98.4 F (36.9 C) (Oral)   Ht 5' 6.5" (1.689 m)   Wt 208 lb 3.2 oz (94.4 kg)   BMI 33.10 kg/m   Wt  Readings from Last 3 Encounters:  05/21/18 208 lb 3.2 oz (94.4 kg)  10/18/17 200 lb (90.7 kg)  05/10/17 180 lb (81.6 kg)    Physical Exam  Constitutional: He is oriented to person, place, and time. He appears well-developed and well-nourished. No distress.  Obese with central adiposity  HENT:  Head: Normocephalic and atraumatic.  Right Ear: External ear normal.  Left Ear: External ear normal.  Nose: Nose normal.  Mouth/Throat: Oropharynx is clear and moist.  Eyes: Pupils are equal, round, and reactive to light. Conjunctivae are normal.  Neck: Normal range of motion. Neck supple. No JVD present. No tracheal deviation present. No thyromegaly present.  Cardiovascular: Normal rate, regular rhythm, normal heart sounds and intact distal pulses. Exam reveals no gallop and no friction rub.  No murmur heard. Pulmonary/Chest: Effort normal and breath sounds normal. No respiratory distress.  Abdominal: Soft. Bowel sounds are normal. He exhibits no distension. There is no hepatosplenomegaly. There is no tenderness.  Musculoskeletal: Normal range of motion.  Lymphadenopathy:    He has no cervical adenopathy.  Neurological: He is alert and oriented to person, place, and time. No cranial nerve deficit.  Skin: Skin is warm and dry. Capillary refill takes less than 2 seconds.  Psychiatric: He has a normal mood and affect. His behavior is normal. Judgment and thought content normal.  Nursing note and vitals reviewed.  Results for orders placed or performed during the hospital encounter of 05/10/17  Comprehensive metabolic panel  Result Value Ref Range   Sodium 137 135 - 145 mmol/L   Potassium 3.8 3.5 - 5.1 mmol/L   Chloride 106 101 - 111 mmol/L   CO2 24 22 - 32 mmol/L   Glucose, Bld 122 (H) 65 - 99 mg/dL   BUN 10 6 - 20 mg/dL   Creatinine, Ser 1.610.76 0.61 - 1.24 mg/dL   Calcium 9.5 8.9 - 09.610.3 mg/dL   Total Protein 7.3 6.5 - 8.1 g/dL   Albumin 4.4 3.5 - 5.0 g/dL   AST 23 15 - 41 U/L   ALT 28  17 - 63 U/L   Alkaline Phosphatase 66 38 - 126 U/L   Total Bilirubin 0.6 0.3 - 1.2 mg/dL   GFR calc non Af Amer >60 >60 mL/min   GFR calc Af Amer >60 >60 mL/min   Anion gap 7 5 - 15  Ethanol  Result Value Ref Range   Alcohol, Ethyl (B) <10 <10 mg/dL  Salicylate level  Result Value Ref Range   Salicylate Lvl <7.0 2.8 - 30.0 mg/dL  Acetaminophen level  Result Value Ref Range   Acetaminophen (Tylenol), Serum <10 (L) 10 - 30 ug/mL  cbc  Result Value Ref Range   WBC 5.2 3.8 - 10.6 K/uL   RBC 5.22 4.40 - 5.90 MIL/uL   Hemoglobin 15.0 13.0 - 18.0 g/dL   HCT 04.545.5 40.940.0 - 81.152.0 %   MCV 87.1 80.0 - 100.0 fL   MCH 28.6 26.0 - 34.0 pg   MCHC 32.9 32.0 - 36.0 g/dL   RDW 91.412.4 78.211.5 - 95.614.5 %   Platelets 212 150 - 440 K/uL  Urine Drug Screen, Qualitative  Result Value Ref Range   Tricyclic, Ur Screen NONE DETECTED NONE DETECTED   Amphetamines, Ur Screen NONE DETECTED NONE DETECTED   MDMA (Ecstasy)Ur Screen NONE DETECTED NONE DETECTED   Cocaine Metabolite,Ur Pimaco Two NONE DETECTED NONE DETECTED   Opiate, Ur Screen NONE DETECTED NONE DETECTED   Phencyclidine (PCP) Ur S NONE DETECTED NONE DETECTED   Cannabinoid 50 Ng, Ur [place] NONE DETECTED NONE DETECTED   Barbiturates, Ur Screen NONE DETECTED NONE DETECTED   Benzodiazepine, Ur Scrn NONE DETECTED NONE DETECTED   Methadone Scn, Ur NONE DETECTED NONE DETECTED      Assessment & Plan:   Problem List Items Addressed This Visit    None    Visit Diagnoses    Encounter for annual physical exam    -  Primary   Relevant Orders   CBC with Differential/Platelet   COMPLETE METABOLIC PANEL WITH GFR   Hemoglobin A1c   Lipid panel   TSH   Need for Tdap vaccination       Relevant Orders   Tdap vaccine greater than or equal to 33yo IM (Completed)   Diabetes mellitus screening       Relevant Orders   Hemoglobin A1c   Screening for deficiency anemia       Relevant Orders   CBC with Differential/Platelet   High risk medication use       Relevant Orders    CBC with Differential/Platelet   Hemoglobin A1c   Lipid panel     Annual physical exam with no new findings.  Well adult with no acute concerns.  Patient with long-term use of high risk psychiatric medications that requires lab monitoring more frequently to screen for lipid disorders, metabolic disorders.  Plan: 1. Obtain health maintenance screenings as above according to age. - Increase physical activity to 30 minutes most days of the week.  - Eat healthy diet high in vegetables and fruits; low in refined carbohydrates. - Labs as above for screenings. - Tdap today - Recommended flu shot - patient states it has been received this year.  NO record available, so will request more follow-up with caregivers and administer if needed when patient returns for fasting labs. 2. Return 1 year for annual physical.    Follow up plan: Return in about 1 year (around 05/22/2019) for annual physical.  Wilhelmina Mcardle, DNP, AGPCNP-BC Adult Gerontology Primary Care Nurse Practitioner [facility] 05/21/2018, 1:58 PM

## 2018-05-22 NOTE — Addendum Note (Signed)
Addended by: Wilhelmina McardleKENNEDY, Arrick Dutton R on: 05/22/2018 02:03 PM   Modules accepted: Level of Service

## 2018-05-23 ENCOUNTER — Other Ambulatory Visit: Payer: Medicare Other

## 2018-05-24 LAB — CBC WITH DIFFERENTIAL/PLATELET
Absolute Monocytes: 401 cells/uL (ref 200–950)
Basophils Absolute: 18 cells/uL (ref 0–200)
Basophils Relative: 0.4 %
Eosinophils Absolute: 32 cells/uL (ref 15–500)
Eosinophils Relative: 0.7 %
HCT: 44.1 % (ref 38.5–50.0)
Hemoglobin: 14.1 g/dL (ref 13.2–17.1)
Lymphs Abs: 2084 cells/uL (ref 850–3900)
MCH: 28.2 pg (ref 27.0–33.0)
MCHC: 32 g/dL (ref 32.0–36.0)
MCV: 88.2 fL (ref 80.0–100.0)
MPV: 12.1 fL (ref 7.5–12.5)
Monocytes Relative: 8.9 %
Neutro Abs: 1967 cells/uL (ref 1500–7800)
Neutrophils Relative %: 43.7 %
Platelets: 216 10*3/uL (ref 140–400)
RBC: 5 10*6/uL (ref 4.20–5.80)
RDW: 11.1 % (ref 11.0–15.0)
Total Lymphocyte: 46.3 %
WBC: 4.5 10*3/uL (ref 3.8–10.8)

## 2018-05-24 LAB — COMPLETE METABOLIC PANEL WITH GFR
AG Ratio: 1.7 (calc) (ref 1.0–2.5)
ALT: 23 U/L (ref 9–46)
AST: 16 U/L (ref 10–40)
Albumin: 4.3 g/dL (ref 3.6–5.1)
Alkaline phosphatase (APISO): 68 U/L (ref 40–115)
BUN: 8 mg/dL (ref 7–25)
CO2: 27 mmol/L (ref 20–32)
Calcium: 10 mg/dL (ref 8.6–10.3)
Chloride: 106 mmol/L (ref 98–110)
Creat: 1.23 mg/dL (ref 0.60–1.35)
GFR, Est African American: 89 mL/min/{1.73_m2} (ref >=60)
GFR, Est Non African American: 77 mL/min/{1.73_m2} (ref >=60)
Globulin: 2.5 g/dL (calc) (ref 1.9–3.7)
Glucose, Bld: 93 mg/dL (ref 65–99)
Potassium: 4.3 mmol/L (ref 3.5–5.3)
Sodium: 142 mmol/L (ref 135–146)
Total Bilirubin: 0.5 mg/dL (ref 0.2–1.2)
Total Protein: 6.8 g/dL (ref 6.1–8.1)

## 2018-05-24 LAB — LIPID PANEL
Cholesterol: 199 mg/dL (ref <200)
HDL: 40 mg/dL — ABNORMAL LOW (ref >40)
LDL Cholesterol (Calc): 138 mg/dL (calc) — ABNORMAL HIGH
Non-HDL Cholesterol (Calc): 159 mg/dL (calc) — ABNORMAL HIGH (ref <130)
Total CHOL/HDL Ratio: 5 (calc) — ABNORMAL HIGH (ref <5.0)
Triglycerides: 106 mg/dL (ref <150)

## 2018-05-24 LAB — HEMOGLOBIN A1C
Hgb A1c MFr Bld: 4.9 % of total Hgb (ref <5.7)
Mean Plasma Glucose: 94 (calc)
eAG (mmol/L): 5.2 (calc)

## 2018-05-24 LAB — TSH: TSH: 1.16 mIU/L (ref 0.40–4.50)

## 2018-09-01 IMAGING — CT CT HEAD W/O CM
3 series · 15 of 47 positions shown, 18 images · non-contrast
Comparison: 02/18/2017.

CLINICAL DATA: Patient complains of weakness and shakiness.

EXAM:
CT HEAD WITHOUT CONTRAST
TECHNIQUE: Contiguous axial images were obtained from the base of the skull
through the vertex without intravenous contrast.

[Series 3: head wo · axial · 0.42mm/px · z∈[-104,+21]mm · 9 of 30 slices shown, 12 images]
[im 3/30  brain]
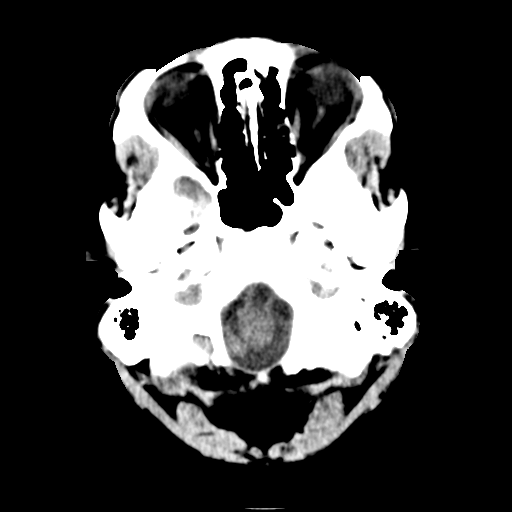
[im 3/30  bone]
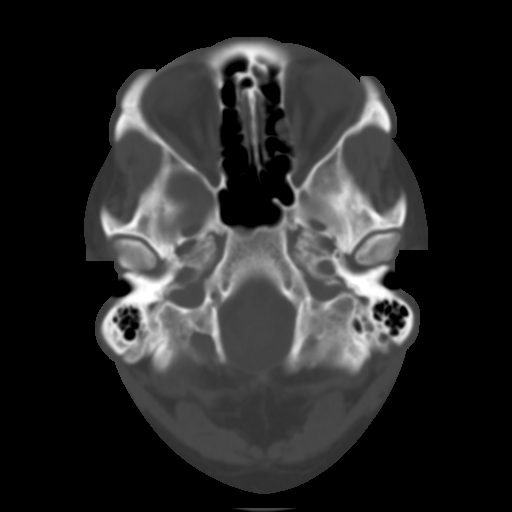
[im 6/30  brain]
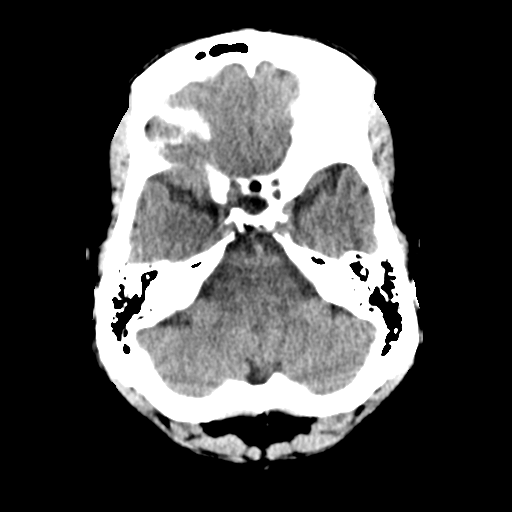
[im 9/30  brain]
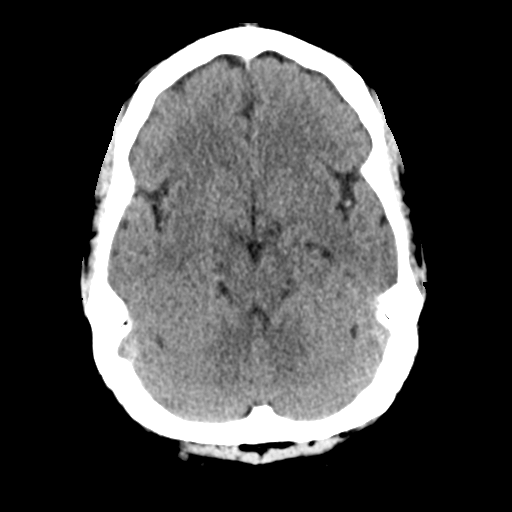
[im 12/30  brain]
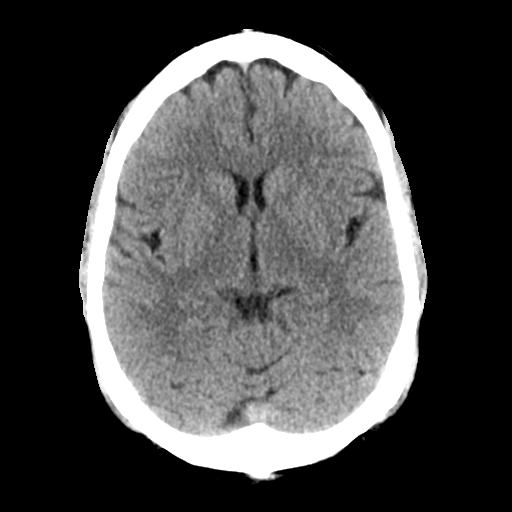
[im 16/30  brain]
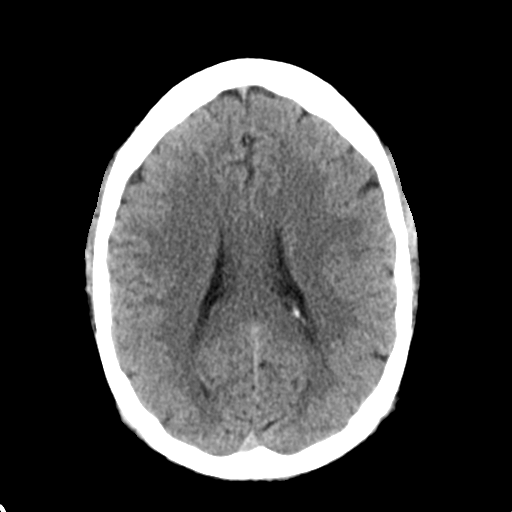
[im 16/30  bone]
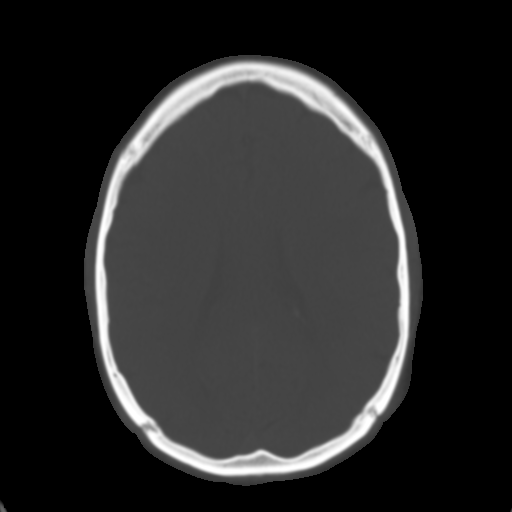
[im 19/30  brain]
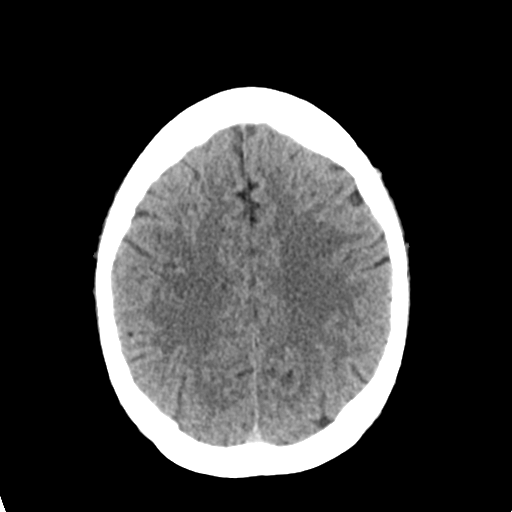
[im 22/30  brain]
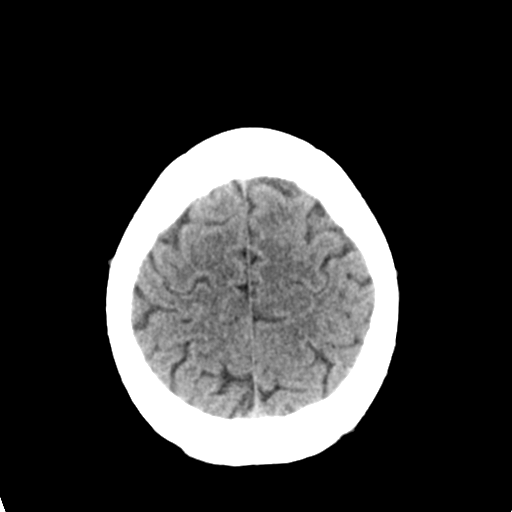
[im 25/30  brain]
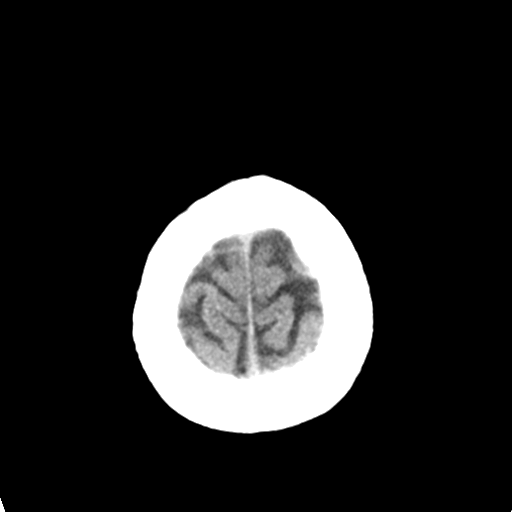
[im 28/30  brain]
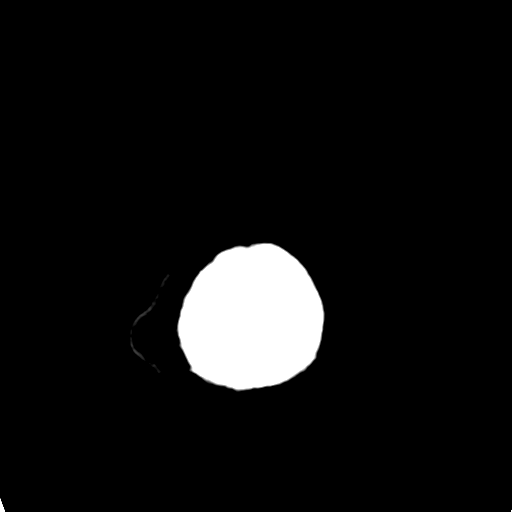
[im 28/30  bone]
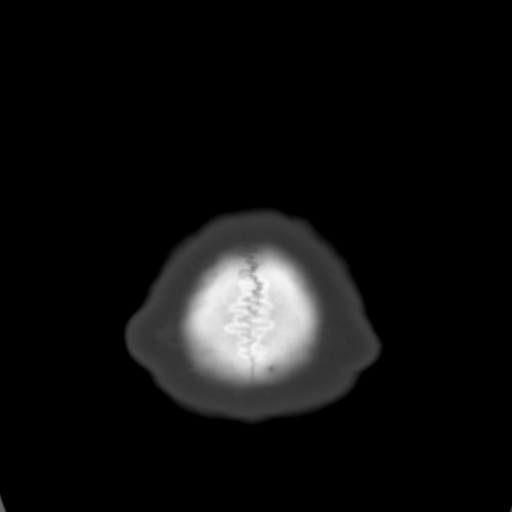

[Series 4: coronal soft tissue · coronal · 0.30mm/px · 3 of 67 slices shown]
[im 23/67  brain]
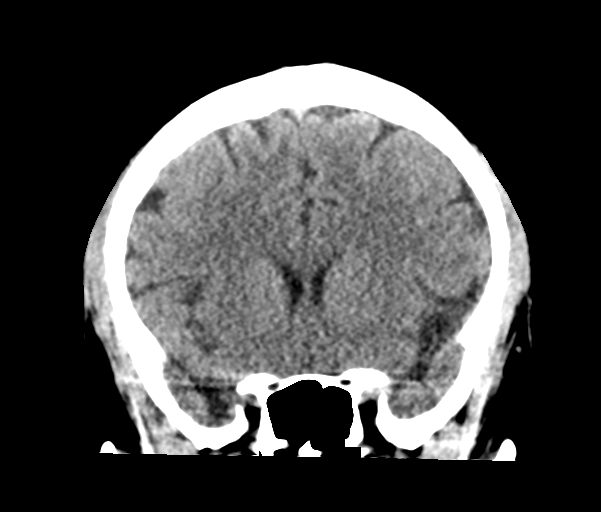
[im 30/67  brain]
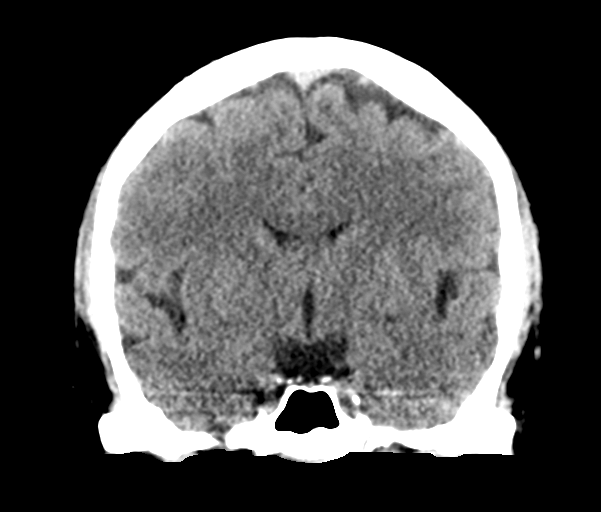
[im 37/67  brain]
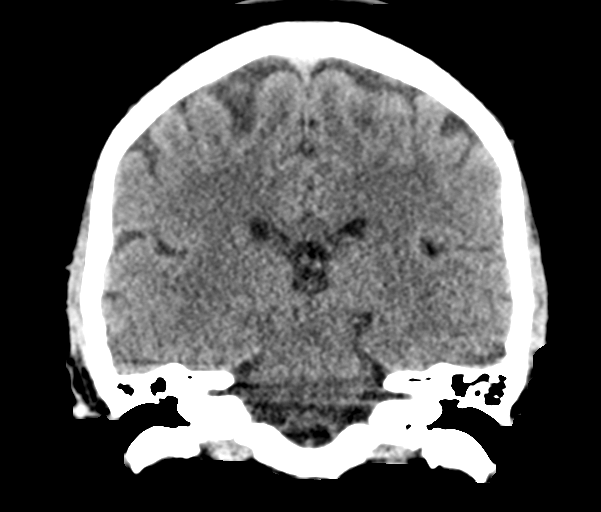

[Series 5: sagittal soft tissue · sagittal · 0.33mm/px · 3 of 52 slices shown]
[im 18/52  brain]
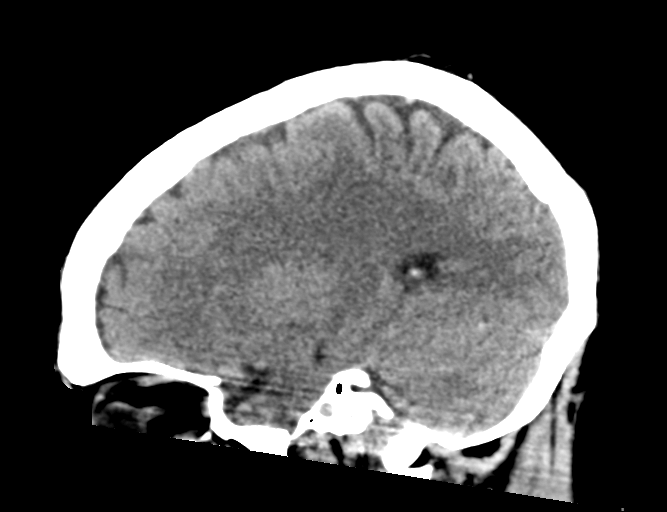
[im 26/52  brain]
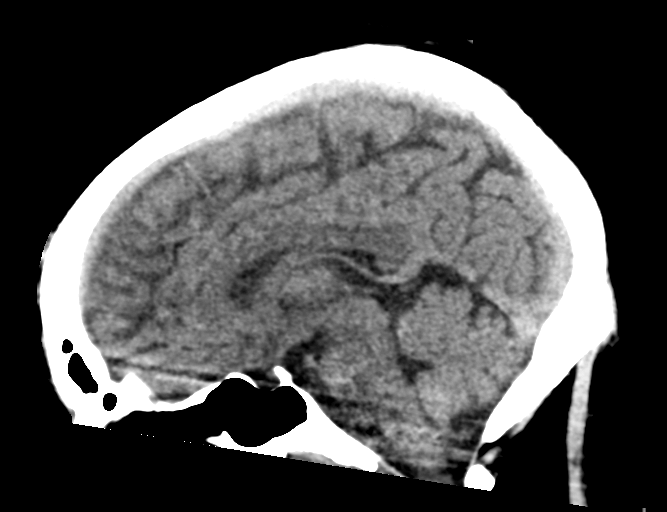
[im 35/52  brain]
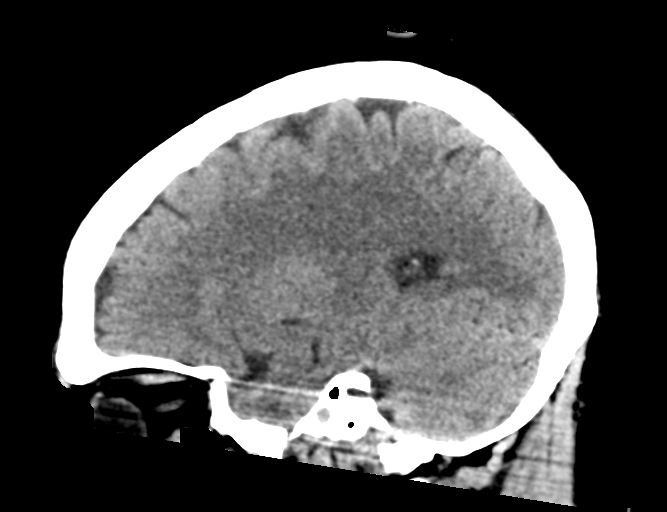

[15 of 47 positions shown; findings below may reference images not displayed]

FINDINGS: Brain: No evidence for acute infarction, hemorrhage, mass lesion,
hydrocephalus, or extra-axial fluid. Normal for age cerebral volume.
No white matter disease.

Vascular: No hyperdense vessel or unexpected calcification.

Skull: Normal. Negative for fracture or focal lesion.

Sinuses/Orbits: No acute finding.

Other: None.
IMPRESSION: Negative exam.  No change from priors.

## 2019-04-29 ENCOUNTER — Ambulatory Visit: Payer: Medicare Other | Admitting: Family Medicine

## 2019-08-12 ENCOUNTER — Encounter: Payer: Medicare Other | Admitting: Family Medicine

## 2019-08-13 ENCOUNTER — Encounter: Payer: Medicare Other | Admitting: Family Medicine

## 2019-09-16 ENCOUNTER — Ambulatory Visit (INDEPENDENT_AMBULATORY_CARE_PROVIDER_SITE_OTHER): Payer: Medicare Other | Admitting: Family Medicine

## 2019-09-16 ENCOUNTER — Other Ambulatory Visit: Payer: Self-pay

## 2019-09-16 ENCOUNTER — Encounter: Payer: Self-pay | Admitting: Family Medicine

## 2019-09-16 VITALS — BP 116/58 | HR 58 | Temp 97.7°F | Ht 66.5 in | Wt 218.2 lb

## 2019-09-16 DIAGNOSIS — Z6834 Body mass index (BMI) 34.0-34.9, adult: Secondary | ICD-10-CM | POA: Diagnosis not present

## 2019-09-16 DIAGNOSIS — Z0000 Encounter for general adult medical examination without abnormal findings: Secondary | ICD-10-CM | POA: Insufficient documentation

## 2019-09-16 DIAGNOSIS — Z79899 Other long term (current) drug therapy: Secondary | ICD-10-CM | POA: Diagnosis not present

## 2019-09-16 DIAGNOSIS — R635 Abnormal weight gain: Secondary | ICD-10-CM

## 2019-09-16 LAB — POCT URINALYSIS DIPSTICK
Bilirubin, UA: NEGATIVE
Blood, UA: NEGATIVE
Glucose, UA: NEGATIVE
Ketones, UA: NEGATIVE
Leukocytes, UA: NEGATIVE
Nitrite, UA: NEGATIVE
Protein, UA: NEGATIVE
Spec Grav, UA: <=1.005 — AB (ref 1.010–1.025)
Urobilinogen, UA: 0.2 E.U./dL
pH, UA: 5 (ref 5.0–8.0)

## 2019-09-16 NOTE — Progress Notes (Addendum)
Subjective:    Patient ID: Thomas Forbes, male    DOB: 1985/08/16, 34 y.o.   MRN: [id number]  Thomas Forbes is a 34 y.o. male presenting on 09/16/2019 for Weight Gain   HPI  Annual Medicare Wellness Exam Patient has been feeling well.  They have no acute concerns today. Sleeps 8-9 hours per night uninterrupted.  HEALTH MAINTENANCE: Weight/BMI: Increasing, has gained 10lbs since last visit 05/2018 Physical activity: Regular Diet: Regular Seatbelt: Always Sunscreen: Not regularly Optometry: Not regularly Dentistry: Goes regularly  VACCINES: Tetanus: Up to date: 05/21/2018 Influenza: Due next season   No flowsheet data found.  Social History   Tobacco Use  . Smoking status: Former Smoker    Types: Cigarettes    Quit date: 08/18/2017    Years since quitting: 2.0  . Smokeless tobacco: Never Used  . Tobacco comment: intermittent cigarettes - smoked from friends  Substance Use Topics  . Alcohol use: No  . Drug use: No    Review of Systems  Constitutional: Negative.   HENT: Negative.   Eyes: Negative.   Respiratory: Negative.   Cardiovascular: Negative.   Gastrointestinal: Negative.   Endocrine: Negative.   Genitourinary: Negative.   Musculoskeletal: Negative.   Skin: Negative.   Allergic/Immunologic: Negative.   Neurological: Negative.   Hematological: Negative.   Psychiatric/Behavioral: Negative.    Per HPI unless specifically indicated above     Objective:    BP (!) 116/58 (BP Location: Right Arm, Patient Position: Sitting, Cuff Size: Normal)   Pulse (!) 58   Temp 97.7 F (36.5 C) (Temporal)   Ht 5' 6.5" (1.689 m)   Wt 218 lb 3.2 oz (99 kg)   BMI 34.69 kg/m   Wt Readings from Last 3 Encounters:  09/16/19 218 lb 3.2 oz (99 kg)  05/21/18 208 lb 3.2 oz (94.4 kg)  10/18/17 200 lb (90.7 kg)    Physical Exam Vitals reviewed.  Constitutional:      General: He is not in acute distress.    Appearance: Normal appearance. He is well-developed and  well-groomed. He is obese. He is not ill-appearing or toxic-appearing.  HENT:     Head: Normocephalic.     Right Ear: Tympanic membrane, ear canal and external ear normal. There is no impacted cerumen.     Left Ear: Tympanic membrane, ear canal and external ear normal. There is no impacted cerumen.     Nose: Nose normal. No congestion or rhinorrhea.     Mouth/Throat:     Mouth: Mucous membranes are moist.     Pharynx: Oropharynx is clear. No oropharyngeal exudate or posterior oropharyngeal erythema.  Eyes:     General: Lids are normal. Vision grossly intact. No scleral icterus.       Right eye: No discharge or hordeolum.        Left eye: No discharge or hordeolum.     Extraocular Movements: Extraocular movements intact.     Conjunctiva/sclera: Conjunctivae normal.     Pupils: Pupils are equal, round, and reactive to light.  Neck:     Thyroid: No thyroid mass, thyromegaly or thyroid tenderness.  Cardiovascular:     Rate and Rhythm: Normal rate and regular rhythm.     Pulses: Normal pulses.          Dorsalis pedis pulses are 2+ on the right side and 2+ on the left side.       Posterior tibial pulses are 2+ on the right side and 2+ on the left side.  Heart sounds: Normal heart sounds. No murmur. No friction rub. No gallop.   Pulmonary:     Effort: Pulmonary effort is normal. No respiratory distress.     Breath sounds: Normal breath sounds.  Abdominal:     General: Abdomen is flat. Bowel sounds are normal. There is no distension or abdominal bruit.     Palpations: Abdomen is soft. There is no hepatomegaly, splenomegaly or mass.     Tenderness: There is no abdominal tenderness. There is no guarding or rebound.     Hernia: No hernia is present.  Musculoskeletal:        General: Normal range of motion.     Cervical back: Normal range of motion and neck supple. No tenderness.     Right lower leg: No edema.     Left lower leg: No edema.  Feet:     Right foot:     Skin integrity: Skin  integrity normal.     Left foot:     Skin integrity: Skin integrity normal.  Lymphadenopathy:     Cervical: No cervical adenopathy.  Skin:    General: Skin is warm and dry.     Capillary Refill: Capillary refill takes less than 2 seconds.  Neurological:     General: No focal deficit present.     Mental Status: He is alert and oriented to person, place, and time.     Cranial Nerves: Cranial nerves are intact.     Sensory: Sensation is intact.     Motor: Motor function is intact.     Coordination: Coordination is intact.     Gait: Gait is intact.     Deep Tendon Reflexes: Reflexes normal.  Psychiatric:        Attention and Perception: Attention and perception normal.        Mood and Affect: Mood and affect normal.        Speech: Speech normal.        Behavior: Behavior normal. Behavior is cooperative.        Thought Content: Thought content normal.        Cognition and Memory: Cognition and memory normal.        Judgment: Judgment normal.     Results for orders placed or performed in visit on 09/16/19  CBC with Differential  Result Value Ref Range   WBC 4.3 3.8 - 10.8 Thousand/uL   RBC 4.78 4.20 - 5.80 Million/uL   Hemoglobin 13.6 13.2 - 17.1 g/dL   HCT 43.7 38.5 - 50.0 %   MCV 91.4 80.0 - 100.0 fL   MCH 28.5 27.0 - 33.0 pg   MCHC 31.1 (L) 32.0 - 36.0 g/dL   RDW 11.3 11.0 - 15.0 %   Platelets 204 140 - 400 Thousand/uL   MPV 11.6 7.5 - 12.5 fL   Neutro Abs 1,587 1,500 - 7,800 cells/uL   Lymphs Abs 2,197 850 - 3,900 cells/uL   Absolute Monocytes 464 200 - 950 cells/uL   Eosinophils Absolute 30 15 - 500 cells/uL   Basophils Absolute 22 0 - 200 cells/uL   Neutrophils Relative % 36.9 %   Total Lymphocyte 51.1 %   Monocytes Relative 10.8 %   Eosinophils Relative 0.7 %   Basophils Relative 0.5 %  COMPLETE METABOLIC PANEL WITH GFR  Result Value Ref Range   Glucose, Bld 103 65 - 139 mg/dL   BUN 7 7 - 25 mg/dL   Creat 1.00 0.60 - 1.35 mg/dL   GFR, Est Non  African American  98 > OR = 60 mL/min/1.38m2   GFR, Est African American 113 > OR = 60 mL/min/1.40m2   BUN/Creatinine Ratio NOT APPLICABLE 6 - 22 (calc)   Sodium 140 135 - 146 mmol/L   Potassium 3.9 3.5 - 5.3 mmol/L   Chloride 106 98 - 110 mmol/L   CO2 27 20 - 32 mmol/L   Calcium 9.4 8.6 - 10.3 mg/dL   Total Protein 6.2 6.1 - 8.1 g/dL   Albumin 4.2 3.6 - 5.1 g/dL   Globulin 2.0 1.9 - 3.7 g/dL (calc)   AG Ratio 2.1 1.0 - 2.5 (calc)   Total Bilirubin 0.3 0.2 - 1.2 mg/dL   Alkaline phosphatase (APISO) 57 36 - 130 U/L   AST 16 10 - 40 U/L   ALT 24 9 - 46 U/L  Lipid Profile  Result Value Ref Range   Cholesterol 177 <200 mg/dL   HDL 40 > OR = 40 mg/dL   Triglycerides 163 <845 mg/dL   LDL Cholesterol (Calc) 113 (H) mg/dL (calc)   Total CHOL/HDL Ratio 4.4 <5.0 (calc)   Non-HDL Cholesterol (Calc) 137 (H) <130 mg/dL (calc)  Thyroid Panel With TSH  Result Value Ref Range   T3 Uptake 29 22 - 35 %   T4, Total 8.0 4.9 - 10.5 mcg/dL   Free Thyroxine Index 2.3 1.4 - 3.8   TSH 1.46 0.40 - 4.50 mIU/L  HgB A1c  Result Value Ref Range   Hgb A1c MFr Bld 4.9 <5.7 % of total Hgb   Mean Plasma Glucose 94 (calc)   eAG (mmol/L) 5.2 (calc)  POCT Urinalysis Dipstick  Result Value Ref Range   Color, UA yellow    Clarity, UA clear    Glucose, UA Negative Negative   Bilirubin, UA negative    Ketones, UA negative    Spec Grav, UA <=1.005 (A) 1.010 - 1.025   Blood, UA negative    pH, UA 5.0 5.0 - 8.0   Protein, UA Negative Negative   Urobilinogen, UA 0.2 0.2 or 1.0 E.U./dL   Nitrite, UA negative    Leukocytes, UA Negative Negative   Appearance     Odor        Assessment & Plan:   Problem List Items Addressed This Visit      Other   Routine medical exam - Primary   Relevant Orders   CBC with Differential (Completed)   COMPLETE METABOLIC PANEL WITH GFR (Completed)   Lipid Profile (Completed)   Thyroid Panel With TSH (Completed)   HgB A1c (Completed)   POCT Urinalysis Dipstick (Completed)   Weight gain     Relevant Orders   CBC with Differential (Completed)   COMPLETE METABOLIC PANEL WITH GFR (Completed)   Lipid Profile (Completed)   Thyroid Panel With TSH (Completed)   HgB A1c (Completed)   Encounter for routine history and physical examination of adult    Other Visit Diagnoses    BMI 34.0-34.9,adult       Relevant Orders   CBC with Differential (Completed)   COMPLETE METABOLIC PANEL WITH GFR (Completed)   Lipid Profile (Completed)   Thyroid Panel With TSH (Completed)   HgB A1c (Completed)   Encounter for long-term (current) use of medications       Relevant Orders   CBC with Differential (Completed)   COMPLETE METABOLIC PANEL WITH GFR (Completed)   Lipid Profile (Completed)   Thyroid Panel With TSH (Completed)   HgB A1c (Completed)  No orders of the defined types were placed in this encounter.     Follow up plan: Return in about 1 year (around 09/15/2020) for Physical.   Charlaine Dalton, FNP Family Nurse Practitioner [facility] 09/18/2019, 10:28 AM

## 2019-09-16 NOTE — Patient Instructions (Signed)
As we discussed, have your labs drawn in the next 1-2 weeks and we will contact you with the results.  Continue your current medications as prescribed by your psychiatrist.  We will plan to see you back in 1 year for your next physical  You will receive a survey after today's visit either digitally by e-mail or paper by [employer] mail. Your experiences and feedback matter to [place].  Please respond so we know how we are doing as we provide care for you.  Call us with any questions/concerns/needs.  It is my goal to be available to you for your health concerns.  Thanks for choosing me to be a partner in your healthcare needs!  Charlaine Dalton, FNP-C Family Nurse Practitioner [facility] Phone: [phone]

## 2019-09-16 NOTE — Addendum Note (Signed)
Addended by: Lonna Cobb on: 09/16/2019 11:12 AM   Modules accepted: Orders

## 2019-09-16 NOTE — Assessment & Plan Note (Deleted)
Annual physical exam without new findings.  Well adult with no acute concerns.  Plan: 1. Obtain health maintenance screenings as above according to age. - Increase physical activity to 30 minutes most days of the week.  - Eat healthy diet high in vegetables and fruits; low in refined carbohydrates. - Screening labs and tests as ordered 2. Return 1 year for annual physical.  

## 2019-09-17 LAB — LIPID PANEL
Cholesterol: 177 mg/dL (ref <200)
HDL: 40 mg/dL (ref >=40)
LDL Cholesterol (Calc): 113 mg/dL (calc) — ABNORMAL HIGH
Non-HDL Cholesterol (Calc): 137 mg/dL (calc) — ABNORMAL HIGH (ref <130)
Total CHOL/HDL Ratio: 4.4 (calc) (ref <5.0)
Triglycerides: 126 mg/dL (ref <150)

## 2019-09-17 LAB — COMPLETE METABOLIC PANEL WITH GFR
AG Ratio: 2.1 (calc) (ref 1.0–2.5)
ALT: 24 U/L (ref 9–46)
AST: 16 U/L (ref 10–40)
Albumin: 4.2 g/dL (ref 3.6–5.1)
Alkaline phosphatase (APISO): 57 U/L (ref 36–130)
BUN: 7 mg/dL (ref 7–25)
CO2: 27 mmol/L (ref 20–32)
Calcium: 9.4 mg/dL (ref 8.6–10.3)
Chloride: 106 mmol/L (ref 98–110)
Creat: 1 mg/dL (ref 0.60–1.35)
GFR, Est African American: 113 mL/min/{1.73_m2} (ref >=60)
GFR, Est Non African American: 98 mL/min/{1.73_m2} (ref >=60)
Globulin: 2 g/dL (calc) (ref 1.9–3.7)
Glucose, Bld: 103 mg/dL (ref 65–139)
Potassium: 3.9 mmol/L (ref 3.5–5.3)
Sodium: 140 mmol/L (ref 135–146)
Total Bilirubin: 0.3 mg/dL (ref 0.2–1.2)
Total Protein: 6.2 g/dL (ref 6.1–8.1)

## 2019-09-17 LAB — CBC WITH DIFFERENTIAL/PLATELET
Absolute Monocytes: 464 cells/uL (ref 200–950)
Basophils Absolute: 22 cells/uL (ref 0–200)
Basophils Relative: 0.5 %
Eosinophils Absolute: 30 cells/uL (ref 15–500)
Eosinophils Relative: 0.7 %
HCT: 43.7 % (ref 38.5–50.0)
Hemoglobin: 13.6 g/dL (ref 13.2–17.1)
Lymphs Abs: 2197 cells/uL (ref 850–3900)
MCH: 28.5 pg (ref 27.0–33.0)
MCHC: 31.1 g/dL — ABNORMAL LOW (ref 32.0–36.0)
MCV: 91.4 fL (ref 80.0–100.0)
MPV: 11.6 fL (ref 7.5–12.5)
Monocytes Relative: 10.8 %
Neutro Abs: 1587 cells/uL (ref 1500–7800)
Neutrophils Relative %: 36.9 %
Platelets: 204 10*3/uL (ref 140–400)
RBC: 4.78 10*6/uL (ref 4.20–5.80)
RDW: 11.3 % (ref 11.0–15.0)
Total Lymphocyte: 51.1 %
WBC: 4.3 10*3/uL (ref 3.8–10.8)

## 2019-09-17 LAB — THYROID PANEL WITH TSH
Free Thyroxine Index: 2.3 (ref 1.4–3.8)
T3 Uptake: 29 % (ref 22–35)
T4, Total: 8 ug/dL (ref 4.9–10.5)
TSH: 1.46 mIU/L (ref 0.40–4.50)

## 2019-09-17 LAB — HEMOGLOBIN A1C
Hgb A1c MFr Bld: 4.9 % of total Hgb (ref <5.7)
Mean Plasma Glucose: 94 (calc)
eAG (mmol/L): 5.2 (calc)

## 2019-09-17 NOTE — Progress Notes (Signed)
Labs look great and are within normal limits with the exception of his cholesterol being slightly elevated but improved from last year.  Will see him back in 1 year.

## 2019-09-18 DIAGNOSIS — R635 Abnormal weight gain: Secondary | ICD-10-CM | POA: Insufficient documentation

## 2019-09-18 DIAGNOSIS — Z0000 Encounter for general adult medical examination without abnormal findings: Secondary | ICD-10-CM | POA: Insufficient documentation

## 2019-09-18 NOTE — Assessment & Plan Note (Deleted)
Annual physical exam without new findings.  Well adult with no acute concerns.  Plan: 1. Obtain health maintenance screenings as above according to age. - Increase physical activity to 30 minutes most days of the week and watch caloric intake which can be leading to increased weight gain. - Eat healthy diet high in vegetables and fruits; low in refined carbohydrates. - Screening labs and tests as ordered 2. Return 1 year for annual physical.

## 2019-09-18 NOTE — Addendum Note (Signed)
Addended by: Tarri Fuller on: 09/18/2019 01:03 PM   Modules accepted: Level of Service

## 2019-11-17 ENCOUNTER — Telehealth: Payer: Self-pay

## 2019-11-17 NOTE — Telephone Encounter (Signed)
Copied from CRM [id number]. Topic: General - Inquiry >> Nov 17, 2019 11:43 AM Daphine Deutscher D wrote: Reason for CRM: Thomas Forbes with [facility] called to see if [facility] received a fax that he sent in this morning.  He would like someone to call him back. CB# [phone] He is the Child psychotherapist for this patient  Left a detail message on the patient guardian vm that we received the office note request and that the Office note were faxed over.

## 2020-04-15 ENCOUNTER — Encounter: Payer: Self-pay | Admitting: Family Medicine

## 2020-04-15 ENCOUNTER — Other Ambulatory Visit: Payer: Self-pay

## 2020-04-15 ENCOUNTER — Ambulatory Visit (INDEPENDENT_AMBULATORY_CARE_PROVIDER_SITE_OTHER): Payer: Medicare Other | Admitting: Family Medicine

## 2020-04-15 VITALS — BP 98/71 | HR 93 | Temp 98.4°F | Resp 18 | Ht 66.5 in | Wt 233.0 lb

## 2020-04-15 DIAGNOSIS — F79 Unspecified intellectual disabilities: Secondary | ICD-10-CM | POA: Diagnosis not present

## 2020-04-15 DIAGNOSIS — F201 Disorganized schizophrenia: Secondary | ICD-10-CM | POA: Diagnosis not present

## 2020-04-15 NOTE — Assessment & Plan Note (Signed)
See intellectual disability AP

## 2020-04-15 NOTE — Assessment & Plan Note (Signed)
Updated FL2 form with medications and needs updated and provided to attendant from group home.  RTC in 3-6 months for CPE

## 2020-04-15 NOTE — Progress Notes (Signed)
Subjective:    Patient ID: Thomas Forbes, male    DOB: 06-19-1985, 34 y.o.   MRN: [id number]  Thomas Forbes is a 33 y.o. male presenting on 04/15/2020 for Consult   HPI  Thomas Forbes presents to clinic with attendant from group home.  They are requesting an updated FL2 form today.  No other acute concerns.  No flowsheet data found.  Social History   Tobacco Use  . Smoking status: Former Smoker    Types: Cigarettes    Quit date: 08/18/2017    Years since quitting: 2.6  . Smokeless tobacco: Never Used  . Tobacco comment: intermittent cigarettes - smoked from friends  Vaping Use  . Vaping Use: Never used  Substance Use Topics  . Alcohol use: No  . Drug use: No    Review of Systems  Constitutional: Negative.   HENT: Negative.   Eyes: Negative.   Respiratory: Negative.   Cardiovascular: Negative.   Gastrointestinal: Negative.   Endocrine: Negative.   Genitourinary: Negative.   Musculoskeletal: Negative.   Skin: Negative.   Allergic/Immunologic: Negative.   Neurological: Negative.   Hematological: Negative.   Psychiatric/Behavioral: Negative.    Per HPI unless specifically indicated above     Objective:    BP 98/71 (BP Location: Left Arm, Patient Position: Sitting, Cuff Size: Large)   Pulse 93   Temp 98.4 F (36.9 C) (Oral)   Resp 18   Ht 5' 6.5" (1.689 m)   Wt 233 lb (105.7 kg)   SpO2 97%   BMI 37.04 kg/m   Wt Readings from Last 3 Encounters:  04/15/20 233 lb (105.7 kg)  09/16/19 218 lb 3.2 oz (99 kg)  05/21/18 208 lb 3.2 oz (94.4 kg)    Physical Exam Vitals and nursing note reviewed.  Constitutional:      General: He is not in acute distress.    Appearance: Normal appearance. He is well-developed and well-groomed. He is obese. He is not ill-appearing or toxic-appearing.  HENT:     Head: Normocephalic and atraumatic.     Nose:     Comments: Lesia Sago is in place, covering mouth and nose. Eyes:     General:        Right eye: No discharge.         Left eye: No discharge.     Extraocular Movements: Extraocular movements intact.     Conjunctiva/sclera: Conjunctivae normal.     Pupils: Pupils are equal, round, and reactive to light.  Cardiovascular:     Rate and Rhythm: Normal rate and regular rhythm.     Pulses: Normal pulses.     Heart sounds: Normal heart sounds. No murmur heard.  No friction rub. No gallop.   Pulmonary:     Effort: Pulmonary effort is normal. No respiratory distress.     Breath sounds: Normal breath sounds.  Musculoskeletal:     Right lower leg: No edema.     Left lower leg: No edema.  Skin:    General: Skin is warm and dry.     Capillary Refill: Capillary refill takes less than 2 seconds.  Neurological:     General: No focal deficit present.     Mental Status: He is alert and oriented to person, place, and time.  Psychiatric:        Attention and Perception: Attention normal.        Mood and Affect: Mood and affect normal.        Speech: Speech normal.  Behavior: Behavior normal. Behavior is cooperative.    Results for orders placed or performed in visit on 09/16/19  CBC with Differential  Result Value Ref Range   WBC 4.3 3.8 - 10.8 Thousand/uL   RBC 4.78 4.20 - 5.80 Million/uL   Hemoglobin 13.6 13.2 - 17.1 g/dL   HCT 82.5 38 - 50 %   MCV 91.4 80.0 - 100.0 fL   MCH 28.5 27.0 - 33.0 pg   MCHC 31.1 (L) 32.0 - 36.0 g/dL   RDW 00.3 70.4 - 88.8 %   Platelets 204 140 - 400 Thousand/uL   MPV 11.6 7.5 - 12.5 fL   Neutro Abs 1,587 1,500 - 7,800 cells/uL   Lymphs Abs 2,197 850 - 3,900 cells/uL   Absolute Monocytes 464 200 - 950 cells/uL   Eosinophils Absolute 30 15.0 - 500.0 cells/uL   Basophils Absolute 22 0.0 - 200.0 cells/uL   Neutrophils Relative % 36.9 %   Total Lymphocyte 51.1 %   Monocytes Relative 10.8 %   Eosinophils Relative 0.7 %   Basophils Relative 0.5 %  COMPLETE METABOLIC PANEL WITH GFR  Result Value Ref Range   Glucose, Bld 103 65 - 139 mg/dL   BUN 7 7 - 25 mg/dL   Creat  9.16 9.45 - 0.38 mg/dL   GFR, Est Non African American 98 > OR = 60 mL/min/1.17m2   GFR, Est African American 113 > OR = 60 mL/min/1.61m2   BUN/Creatinine Ratio NOT APPLICABLE 6 - 22 (calc)   Sodium 140 135 - 146 mmol/L   Potassium 3.9 3.5 - 5.3 mmol/L   Chloride 106 98 - 110 mmol/L   CO2 27 20 - 32 mmol/L   Calcium 9.4 8.6 - 10.3 mg/dL   Total Protein 6.2 6.1 - 8.1 g/dL   Albumin 4.2 3.6 - 5.1 g/dL   Globulin 2.0 1.9 - 3.7 g/dL (calc)   AG Ratio 2.1 1.0 - 2.5 (calc)   Total Bilirubin 0.3 0.2 - 1.2 mg/dL   Alkaline phosphatase (APISO) 57 36 - 130 U/L   AST 16 10 - 40 U/L   ALT 24 9 - 46 U/L  Lipid Profile  Result Value Ref Range   Cholesterol 177 <200 mg/dL   HDL 40 > OR = 40 mg/dL   Triglycerides 882 <800 mg/dL   LDL Cholesterol (Calc) 113 (H) mg/dL (calc)   Total CHOL/HDL Ratio 4.4 <5.0 (calc)   Non-HDL Cholesterol (Calc) 137 (H) <130 mg/dL (calc)  Thyroid Panel With TSH  Result Value Ref Range   T3 Uptake 29 22 - 35 %   T4, Total 8.0 4.9 - 10.5 mcg/dL   Free Thyroxine Index 2.3 1.4 - 3.8   TSH 1.46 0.40 - 4.50 mIU/L  HgB A1c  Result Value Ref Range   Hgb A1c MFr Bld 4.9 <5.7 % of total Hgb   Mean Plasma Glucose 94 (calc)   eAG (mmol/L) 5.2 (calc)  POCT Urinalysis Dipstick  Result Value Ref Range   Color, UA yellow    Clarity, UA clear    Glucose, UA Negative Negative   Bilirubin, UA negative    Ketones, UA negative    Spec Grav, UA <=1.005 (A) 1.010 - 1.025   Blood, UA negative    pH, UA 5.0 5.0 - 8.0   Protein, UA Negative Negative   Urobilinogen, UA 0.2 0.2 or 1.0 E.U./dL   Nitrite, UA negative    Leukocytes, UA Negative Negative   Appearance     Odor  Assessment & Plan:   Problem List Items Addressed This Visit      Other   Intellectual disability - Primary    Updated FL2 form with medications and needs updated and provided to attendant from group home.  RTC in 3-6 months for CPE      [facility]izophren[facility])    See intellectual disability AP          No orders of the defined types were placed in this encounter.   Follow up plan: Return in about 6 months (around 10/13/2020) for CPE.   Charlaine Dalton, FNP Family Nurse Practitioner [facility] 04/15/2020, 3:27 PM

## 2020-07-19 ENCOUNTER — Encounter: Payer: Medicare Other | Admitting: Family Medicine

## 2020-09-21 ENCOUNTER — Ambulatory Visit (INDEPENDENT_AMBULATORY_CARE_PROVIDER_SITE_OTHER): Payer: Medicare Other | Admitting: Unknown Physician Specialty

## 2020-09-21 ENCOUNTER — Encounter: Payer: Medicare Other | Admitting: Family Medicine

## 2020-09-21 ENCOUNTER — Encounter: Payer: Self-pay | Admitting: Unknown Physician Specialty

## 2020-09-21 ENCOUNTER — Other Ambulatory Visit: Payer: Self-pay

## 2020-09-21 DIAGNOSIS — Z0000 Encounter for general adult medical examination without abnormal findings: Secondary | ICD-10-CM

## 2020-09-21 NOTE — Assessment & Plan Note (Signed)
For FL2.  This was reviewed and signed.  Due for a physical later this month.

## 2020-09-21 NOTE — Progress Notes (Signed)
BP 125/77 (BP Location: Right Arm, Patient Position: Sitting, Cuff Size: Normal)   Pulse 93   Temp (!) 97.5 F (36.4 C) (Temporal)   Ht 5\' 6"  (1.676 m)   Wt 240 lb 6.4 oz (109 kg)   SpO2 97%   BMI 38.80 kg/m    Subjective:    Patient ID: Thomas Forbes, male    DOB: Dec 07, 1985, 35 y.o.   MRN: [id number]  HPI: Jammy Plotkin is a 35 y.o. male  Chief Complaint  Patient presents with  . Consult   Pt here with caregiver to have FL2 filled out and signed due to changing houses. Pt is doing will with psychiatry managing medications.     Relevant past medical, surgical, family and social history reviewed and updated as indicated. Interim medical history since our last visit reviewed. Allergies and medications reviewed and updated.  Review of Systems  Per HPI unless specifically indicated above     Objective:    BP 125/77 (BP Location: Right Arm, Patient Position: Sitting, Cuff Size: Normal)   Pulse 93   Temp (!) 97.5 F (36.4 C) (Temporal)   Ht 5\' 6"  (1.676 m)   Wt 240 lb 6.4 oz (109 kg)   SpO2 97%   BMI 38.80 kg/m   Wt Readings from Last 3 Encounters:  09/21/20 240 lb 6.4 oz (109 kg)  04/15/20 233 lb (105.7 kg)  09/16/19 218 lb 3.2 oz (99 kg)    Physical Exam Constitutional:      General: He is not in acute distress.    Appearance: Normal appearance. He is well-developed.  HENT:     Head: Normocephalic and atraumatic.  Eyes:     General: Lids are normal. No scleral icterus.       Right eye: No discharge.        Left eye: No discharge.     Conjunctiva/sclera: Conjunctivae normal.  Neck:     Vascular: No carotid bruit or JVD.  Cardiovascular:     Rate and Rhythm: Normal rate and regular rhythm.     Heart sounds: Normal heart sounds.  Pulmonary:     Effort: Pulmonary effort is normal. No respiratory distress.     Breath sounds: Normal breath sounds.  Abdominal:     Palpations: There is no hepatomegaly or splenomegaly.  Musculoskeletal:        General:  Normal range of motion.     Cervical back: Normal range of motion and neck supple.  Skin:    General: Skin is warm and dry.     Coloration: Skin is not pale.     Findings: No rash.  Neurological:     Mental Status: He is alert and oriented to person, place, and time.  Psychiatric:        Behavior: Behavior normal.        Thought Content: Thought content normal.        Judgment: Judgment normal.     Results for orders placed or performed in visit on 09/16/19  CBC with Differential  Result Value Ref Range   WBC 4.3 3.8 - 10.8 Thousand/uL   RBC 4.78 4.20 - 5.80 Million/uL   Hemoglobin 13.6 13.2 - 17.1 g/dL   HCT 11/16/19 11/16/19 - 47.0 %   MCV 91.4 80.0 - 100.0 fL   MCH 28.5 27.0 - 33.0 pg   MCHC 31.1 (L) 32.0 - 36.0 g/dL   RDW 92.9 57.4 - 73.4 %   Platelets 204 140 - 400 Thousand/uL  MPV 11.6 7.5 - 12.5 fL   Neutro Abs 1,587 1,500 - 7,800 cells/uL   Lymphs Abs 2,197 850 - 3,900 cells/uL   Absolute Monocytes 464 200 - 950 cells/uL   Eosinophils Absolute 30 15 - 500 cells/uL   Basophils Absolute 22 0 - 200 cells/uL   Neutrophils Relative % 36.9 %   Total Lymphocyte 51.1 %   Monocytes Relative 10.8 %   Eosinophils Relative 0.7 %   Basophils Relative 0.5 %  COMPLETE METABOLIC PANEL WITH GFR  Result Value Ref Range   Glucose, Bld 103 65 - 139 mg/dL   BUN 7 7 - 25 mg/dL   Creat 4.40 1.02 - 7.25 mg/dL   GFR, Est Non African American 98 > OR = 60 mL/min/1.56m2   GFR, Est African American 113 > OR = 60 mL/min/1.54m2   BUN/Creatinine Ratio NOT APPLICABLE 6 - 22 (calc)   Sodium 140 135 - 146 mmol/L   Potassium 3.9 3.5 - 5.3 mmol/L   Chloride 106 98 - 110 mmol/L   CO2 27 20 - 32 mmol/L   Calcium 9.4 8.6 - 10.3 mg/dL   Total Protein 6.2 6.1 - 8.1 g/dL   Albumin 4.2 3.6 - 5.1 g/dL   Globulin 2.0 1.9 - 3.7 g/dL (calc)   AG Ratio 2.1 1.0 - 2.5 (calc)   Total Bilirubin 0.3 0.2 - 1.2 mg/dL   Alkaline phosphatase (APISO) 57 36 - 130 U/L   AST 16 10 - 40 U/L   ALT 24 9 - 46 U/L  Lipid  Profile  Result Value Ref Range   Cholesterol 177 <200 mg/dL   HDL 40 > OR = 40 mg/dL   Triglycerides 366 <440 mg/dL   LDL Cholesterol (Calc) 113 (H) mg/dL (calc)   Total CHOL/HDL Ratio 4.4 <5.0 (calc)   Non-HDL Cholesterol (Calc) 137 (H) <130 mg/dL (calc)  Thyroid Panel With TSH  Result Value Ref Range   T3 Uptake 29 22 - 35 %   T4, Total 8.0 4.9 - 10.5 mcg/dL   Free Thyroxine Index 2.3 1.4 - 3.8   TSH 1.46 0.40 - 4.50 mIU/L  HgB A1c  Result Value Ref Range   Hgb A1c MFr Bld 4.9 <5.7 % of total Hgb   Mean Plasma Glucose 94 (calc)   eAG (mmol/L) 5.2 (calc)  POCT Urinalysis Dipstick  Result Value Ref Range   Color, UA yellow    Clarity, UA clear    Glucose, UA Negative Negative   Bilirubin, UA negative    Ketones, UA negative    Spec Grav, UA <=1.005 (A) 1.010 - 1.025   Blood, UA negative    pH, UA 5.0 5.0 - 8.0   Protein, UA Negative Negative   Urobilinogen, UA 0.2 0.2 or 1.0 E.U./dL   Nitrite, UA negative    Leukocytes, UA Negative Negative   Appearance     Odor        Assessment & Plan:   Problem List Items Addressed This Visit      Unprioritized   Routine medical exam    For FL2.  This was reviewed and signed.  Due for a physical later this month.            Follow up plan: Return if symptoms worsen or fail to improve.

## 2020-10-06 ENCOUNTER — Ambulatory Visit (INDEPENDENT_AMBULATORY_CARE_PROVIDER_SITE_OTHER): Payer: Medicare Other | Admitting: Internal Medicine

## 2020-10-06 ENCOUNTER — Other Ambulatory Visit: Payer: Self-pay

## 2020-10-06 ENCOUNTER — Encounter: Payer: Self-pay | Admitting: Internal Medicine

## 2020-10-06 VITALS — BP 106/54 | HR 72 | Temp 97.5°F | Resp 18 | Ht 66.0 in | Wt 242.0 lb

## 2020-10-06 DIAGNOSIS — E010 Iodine-deficiency related diffuse (endemic) goiter: Secondary | ICD-10-CM

## 2020-10-06 DIAGNOSIS — Z79899 Other long term (current) drug therapy: Secondary | ICD-10-CM

## 2020-10-06 DIAGNOSIS — E6609 Other obesity due to excess calories: Secondary | ICD-10-CM | POA: Diagnosis not present

## 2020-10-06 DIAGNOSIS — F319 Bipolar disorder, unspecified: Secondary | ICD-10-CM | POA: Diagnosis not present

## 2020-10-06 DIAGNOSIS — Z0000 Encounter for general adult medical examination without abnormal findings: Secondary | ICD-10-CM

## 2020-10-06 DIAGNOSIS — Z6839 Body mass index (BMI) 39.0-39.9, adult: Secondary | ICD-10-CM

## 2020-10-06 DIAGNOSIS — F201 Disorganized schizophrenia: Secondary | ICD-10-CM

## 2020-10-06 NOTE — Assessment & Plan Note (Signed)
Managed on Cogentin, Haldol and Risperdal C-Met and A1c today for medication monitoring He will continue to follow with psychiatry

## 2020-10-06 NOTE — Patient Instructions (Signed)

## 2020-10-06 NOTE — Progress Notes (Signed)
Subjective:      HPI:  Patient presents the clinic today for his subsequent annual Medicare wellness exam.  He is also due to follow-up chronic conditions.  Bipolar/Schizophrenia: Managed on Cogentin, Haldol and Risperdal.  He denies auditory/visual hallucinations or paranoia at this time.  He does live in a group home.  He does not see a therapist but does follow with psychiatry.  He denies anxiety, SI/HI.  Past Medical History:  Diagnosis Date  . Bipolar 1 disorder ([place])   . Chronic disorganized schizophrenia ([place])   . Schizophrenia ([place])     Current Outpatient Medications  Medication Sig Dispense Refill  . acetaminophen (TYLENOL) 500 MG tablet Take 500 mg by mouth every 6 (six) hours as needed.    . benztropine (COGENTIN) 2 MG tablet Take 2 mg by mouth at bedtime.    . Cholecalciferol (VITAMIN D) 2000 units tablet Take 2,000 Units by mouth daily.    . haloperidol decanoate (HALDOL DECANOATE) 100 MG/ML injection   4  . risperiDONE (RISPERDAL) 2 MG tablet Take 2 mg by mouth daily.    . risperidone (RISPERDAL) 4 MG tablet Take 4 mg by mouth at bedtime.     No current facility-administered medications for this visit.    No Known Allergies  History reviewed. No pertinent family history.  Social History   Socioeconomic History  . Marital status: Single    Spouse name: Not on file  . Number of children: Not on file  . Years of education: Not on file  . Highest education level: 10th grade  Occupational History  . Not on file  Tobacco Use  . Smoking status: Former Smoker    Types: Cigarettes    Quit date: 08/18/2017    Years since quitting: 3.1  . Smokeless tobacco: Never Used  . Tobacco comment: intermittent cigarettes - smoked from friends  Vaping Use  . Vaping Use: Never used  Substance and Sexual Activity  . Alcohol use: No  . Drug use: No  . Sexual activity: Not on file  Other Topics Concern  . Not on file  Social History Narrative  . Not on file   Social  Determinants of Health   Financial Resource Strain: Not on file  Food Insecurity: Not on file  Transportation Needs: Not on file  Physical Activity: Not on file  Stress: Not on file  Social Connections: Not on file  Intimate Partner Violence: Not on file    Hospitiliaztions: None  Health Maintenance:    Flu: Never  Tetanus: 05/2018  COVID: Levan Hurst x3  Dental Exam: Annual   Providers:   PCP: Webb Silversmith, NP  Psychiatry: He does not remember the name of the doctor   I have personally reviewed and have noted:  1. The patient's medical and social history 2. Their use of alcohol, tobacco or illicit drugs 3. Their current medications and supplements 4. The patient's functional ability including ADL's, fall risks, home safety risks and hearing or visual impairment. 5. Diet and physical activities 6. Evidence for depression or mood disorder  Subjective:   Review of Systems:   Constitutional: Denies fever, malaise, fatigue, headache or abrupt weight changes.  HEENT: Denies eye pain, eye redness, ear pain, ringing in the ears, wax buildup, runny nose, nasal congestion, bloody nose, or sore throat. Respiratory: Denies difficulty breathing, shortness of breath, cough or sputum production.   Cardiovascular: Denies chest pain, chest tightness, palpitations or swelling in the hands or feet.  Gastrointestinal: Denies abdominal  pain, bloating, constipation, diarrhea or blood in the stool.  GU: Denies urgency, frequency, pain with urination, burning sensation, blood in urine, odor or discharge. Musculoskeletal: Denies decrease in range of motion, difficulty with gait, muscle pain or joint pain and swelling.  Skin: He would like me to evaluate his penis.  He reports he got in the late and just wants to make sure nothing is wrong with it.  He denies redness, rashes, lesions or ulcercations.  Neurological: Denies dizziness, difficulty with memory, difficulty with speech or problems with  balance and coordination.  Psych: Patient has a history of depression.  Denies anxiety, SI/HI.  No other specific complaints in a complete review of systems (except as listed in HPI above).  Objective:  PE:   BP (!) 106/54 (BP Location: Right Arm, Patient Position: Sitting, Cuff Size: Large)   Pulse 72   Temp (!) 97.5 F (36.4 C) (Temporal)   Resp 18   Ht '5\' 6"'  (1.676 m)   Wt 242 lb (109.8 kg)   SpO2 99%   BMI 39.06 kg/m  Wt Readings from Last 3 Encounters:  10/06/20 242 lb (109.8 kg)  09/21/20 240 lb 6.4 oz (109 kg)  04/15/20 233 lb (105.7 kg)    General: Appears his stated age, obese, in NAD. Skin: Warm, dry and intact. No rashes, lesions or ulcerations noted. HEENT: Head: normal shape and size; Eyes: sclera white, no icterus, conjunctiva pink, PERRLA and EOMs intact;  Neck: Neck supple, trachea midline. No masses, lumps present. Thyromegaly noted. Cardiovascular: Normal rate and rhythm. S1,S2 noted.  No murmur, rubs or gallops noted. No JVD or BLE edema.  Pulmonary/Chest: Normal effort and positive vesicular breath sounds. No respiratory distress. No wheezes, rales or ronchi noted.  Abdomen: Soft and nontender. Normal bowel sounds. No distention or masses noted. Liver, spleen and kidneys non palpable. GU: Normal male anatomy. Musculoskeletal: Strength 5/5 BUE/BLE. No signs of joint swelling.  Neurological: Alert and oriented.  Intellectual disability noted.  Cranial nerves II-XII grossly intact. Coordination normal.  Psychiatric: Mood and affect flat.  Behavior is normal. Judgment and thought content normal.     BMET    Component Value Date/Time   NA 140 09/16/2019 1044   K 3.9 09/16/2019 1044   CL 106 09/16/2019 1044   CO2 27 09/16/2019 1044   GLUCOSE 103 09/16/2019 1044   BUN 7 09/16/2019 1044   CREATININE 1.00 09/16/2019 1044   CALCIUM 9.4 09/16/2019 1044   GFRNONAA 98 09/16/2019 1044   GFRAA 113 09/16/2019 1044    Lipid Panel     Component Value  Date/Time   CHOL 177 09/16/2019 1044   TRIG 126 09/16/2019 1044   HDL 40 09/16/2019 1044   CHOLHDL 4.4 09/16/2019 1044   LDLCALC 113 (H) 09/16/2019 1044    CBC    Component Value Date/Time   WBC 4.3 09/16/2019 1044   RBC 4.78 09/16/2019 1044   HGB 13.6 09/16/2019 1044   HCT 43.7 09/16/2019 1044   PLT 204 09/16/2019 1044   MCV 91.4 09/16/2019 1044   MCH 28.5 09/16/2019 1044   MCHC 31.1 (L) 09/16/2019 1044   RDW 11.3 09/16/2019 1044   LYMPHSABS 2,197 09/16/2019 1044   EOSABS 30 09/16/2019 1044   BASOSABS 22 09/16/2019 1044    Hgb A1C Lab Results  Component Value Date   HGBA1C 4.9 09/16/2019      Assessment and Plan:   Medicare Annual Wellness Visit:  Diet: He does eat meat.  He consumes  more fruits and vegetables.  He does eat fried foods.  He drinks mostly water, soda or fruit juice. Physical activity: Walking Depression/mood screen: Chronic, PHQ 9 score of 0 Hearing: Intact to whispered voice Visual acuity: Grossly normal ADLs: Capable Fall risk: None Home safety: Good Cognitive evaluation: Intact to orientation, naming, recall and repetition EOL planning: No adv directives, full code/ I agree  Preventative Medicine: Encouraged him to get a flu shot in fall.  Tetanus UTD. COVID UTD, he will bring [place] a copy of his card.  Encouraged him to consume a balanced diet and exercise regimen.  Advised him to see an eye doctor and dentist annually.  Will check CBC, c-Met, TSH, lipid and A1c today.  Due dates for screening exam given to patient as part of his AVS.  Thyromegaly:  TSH today  Next appointment: 1 year, Medicare wellness exam.   Webb Silversmith, NP This visit occurred during the SARS-CoV-2 public health emergency.  Safety protocols were in place, including screening questions prior to the visit, additional usage of staff PPE, and extensive cleaning of exam room while observing appropriate contact time as indicated for disinfecting solutions.

## 2020-10-07 LAB — CBC
HCT: 43.5 % (ref 38.5–50.0)
Hemoglobin: 13.8 g/dL (ref 13.2–17.1)
MCH: 28.7 pg (ref 27.0–33.0)
MCHC: 31.7 g/dL — ABNORMAL LOW (ref 32.0–36.0)
MCV: 90.4 fL (ref 80.0–100.0)
MPV: 11.5 fL (ref 7.5–12.5)
Platelets: 229 10*3/uL (ref 140–400)
RBC: 4.81 10*6/uL (ref 4.20–5.80)
RDW: 11.5 % (ref 11.0–15.0)
WBC: 4.9 10*3/uL (ref 3.8–10.8)

## 2020-10-07 LAB — COMPREHENSIVE METABOLIC PANEL
AG Ratio: 2.4 (calc) (ref 1.0–2.5)
ALT: 24 U/L (ref 9–46)
AST: 18 U/L (ref 10–40)
Albumin: 4.6 g/dL (ref 3.6–5.1)
Alkaline phosphatase (APISO): 65 U/L (ref 36–130)
BUN: 8 mg/dL (ref 7–25)
CO2: 28 mmol/L (ref 20–32)
Calcium: 9.4 mg/dL (ref 8.6–10.3)
Chloride: 107 mmol/L (ref 98–110)
Creat: 1.11 mg/dL (ref 0.60–1.35)
Globulin: 1.9 g/dL (calc) (ref 1.9–3.7)
Glucose, Bld: 96 mg/dL (ref 65–99)
Potassium: 4.4 mmol/L (ref 3.5–5.3)
Sodium: 142 mmol/L (ref 135–146)
Total Bilirubin: 0.4 mg/dL (ref 0.2–1.2)
Total Protein: 6.5 g/dL (ref 6.1–8.1)

## 2020-10-07 LAB — HEMOGLOBIN A1C
Hgb A1c MFr Bld: 5.1 % of total Hgb (ref <5.7)
Mean Plasma Glucose: 100 mg/dL
eAG (mmol/L): 5.5 mmol/L

## 2020-10-07 LAB — LIPID PANEL
Cholesterol: 194 mg/dL (ref <200)
HDL: 42 mg/dL (ref >=40)
LDL Cholesterol (Calc): 126 mg/dL (calc) — ABNORMAL HIGH
Non-HDL Cholesterol (Calc): 152 mg/dL (calc) — ABNORMAL HIGH (ref <130)
Total CHOL/HDL Ratio: 4.6 (calc) (ref <5.0)
Triglycerides: 145 mg/dL (ref <150)

## 2020-10-07 LAB — TSH: TSH: 1.4 mIU/L (ref 0.40–4.50)

## 2020-11-19 ENCOUNTER — Telehealth: Payer: Self-pay

## 2020-11-19 NOTE — Telephone Encounter (Signed)
Copied from CRM #[id number]. Topic: Medical Record Request - Other >> Nov 19, 2020  9:59 AM Forbes, Thomas E wrote: Reason for CRM: Mr. Bragg would like a call to confirm fax he is sending for records has been received / please advise 

## 2020-11-19 NOTE — Telephone Encounter (Signed)
Copied from CRM [id number]. Topic: Medical Record Request - Other >> Nov 19, 2020  9:59 AM Wyonia Hough E wrote: Reason for CRM: Mr. Thomas Forbes would like a call to confirm fax he is sending for records has been received / please advise

## 2020-11-19 NOTE — Telephone Encounter (Signed)
Copied from CRM [id number]. Topic: Medical Record Request - Other >> Nov 19, 2020  9:59 AM Wyonia Hough E wrote: Reason for CRM: Mr. Sol Blazing would like a call to confirm fax he is sending for records has been received / please advise   I called Iantha Fallen back and left a detail message on his personal vm that the fax was received.

## 2020-11-26 ENCOUNTER — Telehealth: Payer: Self-pay

## 2020-11-26 NOTE — Telephone Encounter (Signed)
Copied from CRM [id number]. Topic: General - Inquiry >> Nov 26, 2020 10:09 AM Daphine Deutscher D wrote: Reason for CRM: Mr. Thomas Forbes with [facility] called asking if he could just get the height and weight and most recent diagnosis of the patient to complete his paperwork.  He sent in a fax for the last OV but has not gotten anything back from the office yet .  CB#  [phone] or fax to [phone]

## 2020-11-26 NOTE — Telephone Encounter (Signed)
I spoke with the patient legal guardian Ramces and verified that he received the office note.

## 2021-06-27 ENCOUNTER — Other Ambulatory Visit: Payer: Self-pay

## 2021-06-27 ENCOUNTER — Emergency Department
Admission: EM | Admit: 2021-06-27 | Discharge: 2021-06-27 | Disposition: A | Discharge status: Home / Self Care | Payer: Medicare Other | Attending: Emergency Medicine | Admitting: Emergency Medicine

## 2021-06-27 DIAGNOSIS — Z593 Problems related to living in residential institution: Secondary | ICD-10-CM | POA: Diagnosis present

## 2021-06-27 NOTE — ED Notes (Signed)
Called all four numbers listed in chart for patient discharge. No answer from any of them. Left message on only one that had voicemail that matched person listed as group home director Birder Robson), which was the mobile number. Unable to reach guardian for discharge purposes.
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
## 2021-06-27 NOTE — ED Triage Notes (Signed)
Patient to ER via [facility] (voluntary). Patient states he left his group home today, staff called [employer] and they brought him to the ER. Patient reports that he no longer feels safe at the group home he lives in. States the staff told him he is not allowed to eat with the other group home members. Patient reports being unhappy with the way he is treated by the staff. Denies SI/HI/ denies pain or any other complaints.

## 2021-08-16 ENCOUNTER — Ambulatory Visit (INDEPENDENT_AMBULATORY_CARE_PROVIDER_SITE_OTHER): Payer: Medicare Other | Admitting: Nurse Practitioner

## 2021-08-16 ENCOUNTER — Encounter: Payer: Self-pay | Admitting: Nurse Practitioner

## 2021-08-16 ENCOUNTER — Other Ambulatory Visit: Payer: Self-pay

## 2021-08-16 VITALS — BP 100/62 | HR 105 | Temp 98.5°F | Resp 16 | Ht 66.5 in | Wt 232.9 lb

## 2021-08-16 DIAGNOSIS — Z79899 Other long term (current) drug therapy: Secondary | ICD-10-CM

## 2021-08-16 DIAGNOSIS — F201 Disorganized schizophrenia: Secondary | ICD-10-CM | POA: Diagnosis not present

## 2021-08-16 DIAGNOSIS — Z131 Encounter for screening for diabetes mellitus: Secondary | ICD-10-CM

## 2021-08-16 DIAGNOSIS — F319 Bipolar disorder, unspecified: Secondary | ICD-10-CM | POA: Diagnosis not present

## 2021-08-16 DIAGNOSIS — F79 Unspecified intellectual disabilities: Secondary | ICD-10-CM

## 2021-08-16 DIAGNOSIS — Z13220 Encounter for screening for lipoid disorders: Secondary | ICD-10-CM

## 2021-08-16 DIAGNOSIS — E785 Hyperlipidemia, unspecified: Secondary | ICD-10-CM

## 2021-08-16 DIAGNOSIS — Z7689 Persons encountering health services in other specified circumstances: Secondary | ICD-10-CM

## 2021-08-16 DIAGNOSIS — Z6837 Body mass index (BMI) 37.0-37.9, adult: Secondary | ICD-10-CM

## 2021-08-16 NOTE — Progress Notes (Signed)
? ?BP 100/62   Pulse (!) 105   Temp 98.5 ?F (36.9 ?C) (Oral)   Resp 16   Ht 5' 6.5" (1.689 m)   Wt 232 lb 14.4 oz (105.6 kg)   SpO2 97%   BMI 37.03 kg/m?   ? ?Subjective:  ? ? Patient ID: Thomas Forbes, male    DOB: 11-26-1985, 36 y.o.   MRN: [id number] ? ?HPI: ?Thomas Forbes is a 36 y.o. male, here with health care aid ? ?Chief Complaint  ?Patient presents with  ? Establish Care  ? ?Establish care: Last medicare wellness visit was 10/06/2021.  Will get him set up for wellness visit for next appointment.  Shireen Quan she is his [employer]. He lives in a group home called [facility]. He says he had blood work done recently.   ? ?Bipolar/schizophrenia/intelectual disability: He says he is under the care of Dr. Janeece Riggers at [facility] for his mental health.  He is currently taking zoloft 25 mg daily, risperidone 4 mg at night, risperidone 2 mg daily, and has a standing order for haldol for prn use.  ? ?Obesity: He says he has to eat what is given to him in the group home. He says he does not have much control over that. Discussed increasing physical activity.  He says he does walk around a lot.  His weight is 232 lbs with a BMI of 37.03.   ? ?Hyperlipidemia: He is not currently on any cholesterol medication. His last LDL was 126 on 10/06/2020.  Will get labs today. Discussed trying to avoid fatty foods.   ? ?Relevant past medical, surgical, family and social history reviewed and updated as indicated. Interim medical history since our last visit reviewed. ?Allergies and medications reviewed and updated. ? ?Review of Systems ? ?Constitutional: Negative for fever or weight change.  ?Respiratory: Negative for cough and shortness of breath.   ?Cardiovascular: Negative for chest pain or palpitations.  ?Gastrointestinal: Negative for abdominal pain, no bowel changes.  ?Musculoskeletal: Negative for gait problem or joint swelling.  ?Skin: Negative for  rash.  ?Neurological: Negative for dizziness or headache.  ?No other specific complaints in a complete review of systems (except as listed in HPI above).  ? ?   ?Objective:  ?  ?BP 100/62   Pulse (!) 105   Temp 98.5 ?F (36.9 ?C) (Oral)   Resp 16   Ht 5' 6.5" (1.689 m)   Wt 232 lb 14.4 oz (105.6 kg)   SpO2 97%   BMI 37.03 kg/m?   ?Wt Readings from Last 3 Encounters:  ?08/16/21 232 lb 14.4 oz (105.6 kg)  ?10/06/20 242 lb (109.8 kg)  ?09/21/20 240 lb 6.4 oz (109 kg)  ?  ?Physical Exam ? ?Constitutional: Patient appears well-developed and well-nourished. Obese  No distress.  ?HEENT: head atraumatic, normocephalic, pupils equal and reactive to light, ears TMs clear, neck supple, throat within normal limits ?Cardiovascular: Normal rate, regular rhythm and normal heart sounds.  No murmur heard. No BLE edema. ?Pulmonary/Chest: Effort normal and breath sounds normal. No respiratory distress. ?Abdominal: Soft.  There is no tenderness. ?Psychiatric: Patient has a normal mood and affect. behavior is normal. Judgment and thought content normal.  ? ?Results for orders placed or performed in visit on 10/06/20  ?CBC  ?Result Value Ref Range  ? WBC 4.9 3.8 - 10.8 Thousand/uL  ? RBC 4.81 4.20 - 5.80 Million/uL  ? Hemoglobin 13.8 13.2 - 17.1 g/dL  ? HCT 43.5  38.5 - 50.0 %  ? MCV 90.4 80.0 - 100.0 fL  ? MCH 28.7 27.0 - 33.0 pg  ? MCHC 31.7 (L) 32.0 - 36.0 g/dL  ? RDW 11.5 11.0 - 15.0 %  ? Platelets 229 140 - 400 Thousand/uL  ? MPV 11.5 7.5 - 12.5 fL  ?Comprehensive metabolic panel  ?Result Value Ref Range  ? Glucose, Bld 96 65 - 99 mg/dL  ? BUN 8 7 - 25 mg/dL  ? Creat 1.11 0.60 - 1.35 mg/dL  ? BUN/Creatinine Ratio NOT APPLICABLE 6 - 22 (calc)  ? Sodium 142 135 - 146 mmol/L  ? Potassium 4.4 3.5 - 5.3 mmol/L  ? Chloride 107 98 - 110 mmol/L  ? CO2 28 20 - 32 mmol/L  ? Calcium 9.4 8.6 - 10.3 mg/dL  ? Total Protein 6.5 6.1 - 8.1 g/dL  ? Albumin 4.6 3.6 - 5.1 g/dL  ? Globulin 1.9 1.9 - 3.7 g/dL (calc)  ? AG Ratio 2.4 1.0 - 2.5 (calc)   ? Total Bilirubin 0.4 0.2 - 1.2 mg/dL  ? Alkaline phosphatase (APISO) 65 36 - 130 U/L  ? AST 18 10 - 40 U/L  ? ALT 24 9 - 46 U/L  ?TSH  ?Result Value Ref Range  ? TSH 1.40 0.40 - 4.50 mIU/L  ?Lipid panel  ?Result Value Ref Range  ? Cholesterol 194 <200 mg/dL  ? HDL 42 > OR = 40 mg/dL  ? Triglycerides 145 <150 mg/dL  ? LDL Cholesterol (Calc) 126 (H) mg/dL (calc)  ? Total CHOL/HDL Ratio 4.6 <5.0 (calc)  ? Non-HDL Cholesterol (Calc) 152 (H) <130 mg/dL (calc)  ?Hemoglobin A1c  ?Result Value Ref Range  ? Hgb A1c MFr Bld 5.1 <5.7 % of total Hgb  ? Mean Plasma Glucose 100 mg/dL  ? eAG (mmol/L) 5.5 mmol/L  ? ?   ?Assessment & Plan:  ? ?1. Bipolar 1 disorder (HCC) ?-continue care directed by psychiatrist ? ?2. Disorganized schizophrenia (HCC) ?-continue care directed by psychiatrist ? ?3. Intellectual disability ? ? ?4. Class 2 obesity due to excess calories with serious comorbidity with body mass index (BMI) of 37.0 to 37.9 in adult ?- monitor portion size and increase physical activity ?- CBC with Differential/Platelet ?- COMPLETE METABOLIC PANEL WITH GFR ?- Lipid panel ? ?5. Hyperlipidemia, unspecified hyperlipidemia type ? ?- Lipid panel ? ?6. Encounter to establish care ?-make appointment for medicare wellness exam ? ?7. High risk medication use ? ?- CBC with Differential/Platelet ?- COMPLETE METABOLIC PANEL WITH GFR ? ?8. Screening for cholesterol level ? ?- Lipid panel ? ?9. Screening for diabetes mellitus ? ?- COMPLETE METABOLIC PANEL WITH GFR  ? ?Follow up plan: ?Return in about 4 weeks (around 09/13/2021) for medicare wellness visit with Jonette Eva, follow up in 6 months with me. ? ? ? ? ? ?

## 2021-08-17 LAB — COMPLETE METABOLIC PANEL WITH GFR
AG Ratio: 2.2 (calc) (ref 1.0–2.5)
ALT: 17 U/L (ref 9–46)
AST: 11 U/L (ref 10–40)
Albumin: 4.3 g/dL (ref 3.6–5.1)
Alkaline phosphatase (APISO): 65 U/L (ref 36–130)
BUN: 9 mg/dL (ref 7–25)
CO2: 28 mmol/L (ref 20–32)
Calcium: 9.1 mg/dL (ref 8.6–10.3)
Chloride: 104 mmol/L (ref 98–110)
Creat: 1.18 mg/dL (ref 0.60–1.26)
Globulin: 2 g/dL (calc) (ref 1.9–3.7)
Glucose, Bld: 81 mg/dL (ref 65–99)
Potassium: 4.5 mmol/L (ref 3.5–5.3)
Sodium: 140 mmol/L (ref 135–146)
Total Bilirubin: 0.4 mg/dL (ref 0.2–1.2)
Total Protein: 6.3 g/dL (ref 6.1–8.1)
eGFR: 82 mL/min/{1.73_m2} (ref >=60)

## 2021-08-17 LAB — CBC WITH DIFFERENTIAL/PLATELET
Absolute Monocytes: 390 cells/uL (ref 200–950)
Basophils Absolute: 21 cells/uL (ref 0–200)
Basophils Relative: 0.4 %
Eosinophils Absolute: 78 cells/uL (ref 15–500)
Eosinophils Relative: 1.5 %
HCT: 42.2 % (ref 38.5–50.0)
Hemoglobin: 13.3 g/dL (ref 13.2–17.1)
Lymphs Abs: 2829 cells/uL (ref 850–3900)
MCH: 28.1 pg (ref 27.0–33.0)
MCHC: 31.5 g/dL — ABNORMAL LOW (ref 32.0–36.0)
MCV: 89 fL (ref 80.0–100.0)
MPV: 11.8 fL (ref 7.5–12.5)
Monocytes Relative: 7.5 %
Neutro Abs: 1882 cells/uL (ref 1500–7800)
Neutrophils Relative %: 36.2 %
Platelets: 216 10*3/uL (ref 140–400)
RBC: 4.74 10*6/uL (ref 4.20–5.80)
RDW: 11.2 % (ref 11.0–15.0)
Total Lymphocyte: 54.4 %
WBC: 5.2 10*3/uL (ref 3.8–10.8)

## 2021-08-17 LAB — LIPID PANEL
Cholesterol: 168 mg/dL (ref <200)
HDL: 35 mg/dL — ABNORMAL LOW (ref >=40)
LDL Cholesterol (Calc): 105 mg/dL (calc) — ABNORMAL HIGH
Non-HDL Cholesterol (Calc): 133 mg/dL (calc) — ABNORMAL HIGH (ref <130)
Total CHOL/HDL Ratio: 4.8 (calc) (ref <5.0)
Triglycerides: 167 mg/dL — ABNORMAL HIGH (ref <150)

## 2021-09-20 ENCOUNTER — Ambulatory Visit: Payer: Medicare Other

## 2021-09-29 ENCOUNTER — Ambulatory Visit: Payer: Medicare Other

## 2021-10-11 ENCOUNTER — Ambulatory Visit (INDEPENDENT_AMBULATORY_CARE_PROVIDER_SITE_OTHER): Payer: Medicare Other

## 2021-10-11 DIAGNOSIS — Z0000 Encounter for general adult medical examination without abnormal findings: Secondary | ICD-10-CM

## 2021-10-11 NOTE — Patient Instructions (Signed)
Thomas Forbes , ?Thank you for taking time to come for your Medicare Wellness Visit. I appreciate your ongoing commitment to your health goals. Please review the following plan we discussed and let me know if I can assist you in the future.  ? ?Screening recommendations/referrals: ?Colonoscopy: due age 37 ?Recommended yearly ophthalmology/optometry visit for glaucoma screening and checkup ?Recommended yearly dental visit for hygiene and checkup ? ?Vaccinations: ?Influenza vaccine: declined ?Pneumococcal vaccine: due ?Tdap vaccine: done 05/21/18 ?Shingles vaccine: due age 7   ?Covid-19: done 07/18/19 & 08/15/19 ? ?Conditions/risks identified: Recommend increasing activity  ? ?Next appointment: Follow up in one year for your annual wellness visit  ? ?Preventive Care 40-64 Years, Male ?Preventive care refers to lifestyle choices and visits with your health care provider that can promote health and wellness. ?What does preventive care include? ?A yearly physical exam. This is also called an annual well check. ?Dental exams once or twice a year. ?Routine eye exams. Ask your health care provider how often you should have your eyes checked. ?Personal lifestyle choices, including: ?Daily care of your teeth and gums. ?Regular physical activity. ?Eating a healthy diet. ?Avoiding tobacco and drug use. ?Limiting alcohol use. ?Practicing safe sex. ?Taking low-dose aspirin every day starting at age 64. ?What happens during an annual well check? ?The services and screenings done by your health care provider during your annual well check will depend on your age, overall health, lifestyle risk factors, and family history of disease. ?Counseling  ?Your health care provider may ask you questions about your: ?Alcohol use. ?Tobacco use. ?Drug use. ?Emotional well-being. ?Home and relationship well-being. ?Sexual activity. ?Eating habits. ?Work and work Astron[employer]. ?Screening  ?You may have the following tests or measurements: ?Height, weight,  and BMI. ?Blood pressure. ?Lipid and cholesterol levels. These may be checked every 5 years, or more frequently if you are over 64 years old. ?Skin check. ?Lung cancer screening. You may have this screening every year starting at age 31 if you have a 30-pack-year history of smoking and currently smoke or have quit within the past 15 years. ?Fecal occult blood test (FOBT) of the stool. You may have this test every year starting at age 101. ?Flexible sigmoidoscopy or colonoscopy. You may have a sigmoidoscopy every 5 years or a colonoscopy every 10 years starting at age 40. ?Prostate cancer screening. Recommendations will vary depending on your family history and other risks. ?Hepatitis C blood test. ?Hepatitis B blood test. ?Sexually transmitted disease (STD) testing. ?Diabetes screening. This is done by checking your blood sugar (glucose) after you have not eaten for a while (fasting). You may have this done every 1-3 years. ?Discuss your test results, treatment options, and if necessary, the need for more tests with your health care provider. ?Vaccines  ?Your health care provider may recommend certain vaccines, such as: ?Influenza vaccine. This is recommended every year. ?Tetanus, diphtheria, and acellular pertussis (Tdap, Td) vaccine. You may need a Td booster every 10 years. ?Zoster vaccine. You may need this after age 83. ?Pneumococcal 13-valent conjugate (PCV13) vaccine. You may need this if you have certain conditions and have not been vaccinated. ?Pneumococcal polysaccharide (PPSV23) vaccine. You may need one or two doses if you smoke cigarettes or if you have certain conditions. ?Talk to your health care provider about which screenings and vaccines you need and how often you need them. ?This information is not intended to replace advice given to you by your health care provider. Make sure you discuss any questions you  have with your health care provider. ?Document Released: 06/25/2015 Document Revised:  02/16/2016 Document Reviewed: 03/30/2015 ?Elsevier Interactive Patient Education ? 2017 [employer]. ? ?Fall Prevention in the Home ?Falls can cause injuries. They can happen to people of all ages. There are many things you can do to make your home safe and to help prevent falls. ?What can I do on the outside of my home? ?Regularly fix the edges of walkways and driveways and fix any cracks. ?Remove anything that might make you trip as you walk through a door, such as a raised step or threshold. ?Trim any bushes or trees on the path to your home. ?Use bright outdoor lighting. ?Clear any walking paths of anything that might make someone trip, such as rocks or tools. ?Regularly check to see if handrails are loose or broken. Make sure that both sides of any steps have handrails. ?Any raised decks and porches should have guardrails on the edges. ?Have any leaves, snow, or ice cleared regularly. ?Use sand or salt on walking paths during winter. ?Clean up any spills in your garage right away. This includes oil or grease spills. ?What can I do in the bathroom? ?Use night lights. ?Install grab bars by the toilet and in the tub and shower. Do not use towel bars as grab bars. ?Use non-skid mats or decals in the tub or shower. ?If you need to sit down in the shower, use a plastic, non-slip stool. ?Keep the floor dry. Clean up any water that spills on the floor as soon as it happens. ?Remove soap buildup in the tub or shower regularly. ?Attach bath mats securely with double-sided non-slip rug tape. ?Do not have throw rugs and other things on the floor that can make you trip. ?What can I do in the bedroom? ?Use night lights. ?Make sure that you have a light by your bed that is easy to reach. ?Do not use any sheets or blankets that are too big for your bed. They should not hang down onto the floor. ?Have a firm chair that has side arms. You can use this for support while you get dressed. ?Do not have throw rugs and other things  on the floor that can make you trip. ?What can I do in the kitchen? ?Clean up any spills right away. ?Avoid walking on wet floors. ?Keep items that you use a lot in easy-to-reach places. ?If you need to reach something above you, use a strong step stool that has a grab bar. ?Keep electrical cords out of the way. ?Do not use floor polish or wax that makes floors slippery. If you must use wax, use non-skid floor wax. ?Do not have throw rugs and other things on the floor that can make you trip. ?What can I do with my stairs? ?Do not leave any items on the stairs. ?Make sure that there are handrails on both sides of the stairs and use them. Fix handrails that are broken or loose. Make sure that handrails are as long as the stairways. ?Check any carpeting to make sure that it is firmly attached to the stairs. Fix any carpet that is loose or worn. ?Avoid having throw rugs at the top or bottom of the stairs. If you do have throw rugs, attach them to the floor with carpet tape. ?Make sure that you have a light switch at the top of the stairs and the bottom of the stairs. If you do not have them, ask someone to add them for you. ?  What else can I do to help prevent falls? ?Wear shoes that: ?Do not have high heels. ?Have rubber bottoms. ?Are comfortable and fit you well. ?Are closed at the toe. Do not wear sandals. ?If you use a stepladder: ?Make sure that it is fully opened. Do not climb a closed stepladder. ?Make sure that both sides of the stepladder are locked into place. ?Ask someone to hold it for you, if possible. ?Clearly mark and make sure that you can see: ?Any grab bars or handrails. ?First and last steps. ?Where the edge of each step is. ?Use tools that help you move around (mobility aids) if they are needed. These include: ?Canes. ?Walkers. ?Scooters. ?Crutches. ?Turn on the lights when you go into a dark area. Replace any light bulbs as soon as they burn out. ?Set up your furniture so you have a clear path. Avoid  moving your furniture around. ?If any of your floors are uneven, fix them. ?If there are any pets around you, be aware of where they are. ?Review your medicines with your doctor. Some medicines can make you

## 2021-10-11 NOTE — Progress Notes (Signed)
? ?Subjective:  ? Thomas Forbes is a 36 y.o. male who presents for Medicare Annual/Subsequent preventive examination. ? ?Virtual Visit via Telephone Note ? ?I connected with  Thomas Forbes on 10/11/21 at  3:30 PM EDT by telephone and verified that I am speaking with the correct person using two identifiers. ? ?Location: ?Patient: home ?Provider: [facility] ?Persons participating in the virtual visit: patient and worker Thomas Forbes Health Advisor ?  ?I discussed the limitations, risks, security and privacy concerns of performing an evaluation and management service by telephone and the availability of in person appointments. The patient expressed understanding and agreed to proceed. ? ?Interactive audio and video telecommunications were attempted between this nurse and patient, however failed, due to patient having technical difficulties OR patient did not have access to video capability.  We continued and completed visit with audio only. ? ?Some vital signs may be absent or patient reported.  ? ?Reather Littler, LPN ? ? ?Review of Systems    ? ?Cardiac Risk Factors include: male gender ? ?   ?Objective:  ?  ?There were no vitals filed for this visit. ?There is no height or weight on file to calculate BMI. ? ? ?  06/27/2021  ?  1:32 PM 05/10/2017  ?  8:44 PM 03/26/2017  ?  3:38 PM 02/18/2017  ?  7:37 PM  ?Advanced Directives  ?Does Patient Have a Medical Advance Directive? Unable to assess, patient is non-responsive or altered mental status No No No  ?Would patient like information on creating a medical advance directive?    No - Patient declined  ? ? ?Current Medications (verified) ?Outpatient Encounter Medications as of 10/11/2021  ?Medication Sig  ? acetaminophen (TYLENOL) 500 MG tablet Take 500 mg by mouth every 6 (six) hours as needed.  ? benztropine (COGENTIN) 2 MG tablet Take 2 mg by mouth at bedtime.  ? Cholecalciferol (VITAMIN D) 2000 units tablet Take 2,000 Units by mouth daily.  ? haloperidol decanoate (HALDOL  DECANOATE) 100 MG/ML injection   ? risperiDONE (RISPERDAL) 2 MG tablet Take 2 mg by mouth daily.  ? risperidone (RISPERDAL) 4 MG tablet Take 4 mg by mouth at bedtime.  ? sertraline (ZOLOFT) 25 MG tablet   ? ?No facility-administered encounter medications on file as of 10/11/2021.  ? ? ?Allergies (verified) ?Patient has no known allergies.  ? ?History: ?Past Medical History:  ?Diagnosis Date  ? Bipolar 1 disorder (HCC)   ? Chronic disorganized schizophrenia (HCC)   ? Schizophrenia (HCC)   ? ?History reviewed. No pertinent surgical history. ?History reviewed. No pertinent family history. ?Social History  ? ?Socioeconomic History  ? Marital status: Single  ?  Spouse name: Not on file  ? Number of children: Not on file  ? Years of education: Not on file  ? Highest education level: 10th grade  ?Occupational History  ? Not on file  ?Tobacco Use  ? Smoking status: Former  ?  Types: Cigarettes  ?  Quit date: 08/18/2017  ?  Years since quitting: 4.1  ? Smokeless tobacco: Never  ? Tobacco comments:  ?  intermittent cigarettes - smoked from friends  ?Vaping Use  ? Vaping Use: Never used  ?Substance and Sexual Activity  ? Alcohol use: No  ? Drug use: No  ? Sexual activity: Not on file  ?Other Topics Concern  ? Not on file  ?Social History Narrative  ? Not on file  ? ?Social Determinants of Health  ? ?Financial Resource Strain: Low Risk   ?  Difficulty of Paying Living Expenses: Not hard at all  ?Food Insecurity: No Food Insecurity  ? Worried About Programme researcher, broadcasting/film/video in the Last Year: Never true  ? Ran Out of Food in the Last Year: Never true  ?Transportation Needs: No Transportation Needs  ? Lack of Transportation (Medical): No  ? Lack of Transportation (Non-Medical): No  ?Physical Activity: Inactive  ? Days of Exercise per Week: 0 days  ? Minutes of Exercise per Session: 0 min  ?Stress: No Stress Concern Present  ? Feeling of Stress : Not at all  ?Social Connections: Socially Isolated  ? Frequency of Communication with Friends and  Family: More than three times a week  ? Frequency of Social Gatherings with Friends and Family: Twice a week  ? Attends Religious Services: Never  ? Active Member of Clubs or Organizations: No  ? Attends Banker Meetings: Never  ? Marital Status: Never married  ? ? ?Tobacco Counseling ?Counseling given: Not Answered ?Tobacco comments: intermittent cigarettes - smoked from friends ? ? ?Clinical Intake: ? ?Pre-visit preparation completed: Yes ? ?Pain : No/denies pain ? ?  ? ?Nutritional Risks: None ?Diabetes: No ? ?How often do you need to have someone help you when you read instructions, pamphlets, or other written materials from your doctor or pharmacy?: 1 - Never ? ? ? ?Interpreter Needed?: No ? ?Information entered by :: Reather Littler LPN ? ? ?Activities of Daily Living ? ?  10/11/2021  ?  3:41 PM 08/16/2021  ?  1:25 PM  ?In your present state of health, do you have any difficulty performing the following activities:  ?Hearing? 0 0  ?Vision? 0 0  ?Difficulty concentrating or making decisions? 0 0  ?Walking or climbing stairs? 0 0  ?Dressing or bathing? 0 0  ?Doing errands, shopping? 1 0  ?Preparing Food and eating ? N   ?Using the Toilet? N   ?In the past six months, have you accidently leaked urine? N   ?Do you have problems with loss of bowel control? N   ?Managing your Medications? N   ?Managing your Finances? N   ?Housekeeping or managing your Housekeeping? N   ? ? ?Patient Care Team: ?Berniece Salines, FNP as PCP - General (Nurse Practitioner) ? ?Indicate any recent Medical Services you may have received from other than [facility] providers in the past year (date may be approximate). ? ?   ?Assessment:  ? This is a routine wellness examination for Thomas Forbes. ? ?Hearing/Vision screen ?Hearing Screening - Comments:: Pt denies hearing difficulty ?Vision Screening - Comments:: Due for eye exam; established at [facility]  ? ?Dietary issues and exercise activities discussed: ?Current Exercise Habits: The  patient does not participate in regular exercise at present, Exercise limited by: None identified ? ? Goals Addressed   ?None ?  ? ?Depression Screen ? ?  10/11/2021  ?  3:39 PM 08/16/2021  ?  1:25 PM 10/06/2020  ?  9:40 AM  ?PHQ 2/9 Scores  ?PHQ - 2 Score 0 0 0  ?PHQ- 9 Score  0 0  ?  ?Fall Risk ? ?  10/11/2021  ?  3:41 PM 08/16/2021  ?  1:25 PM 09/16/2019  ? 10:22 AM  ?Fall Risk   ?Falls in the past year? 0 0 0  ?Number falls in past yr: 0 0 0  ?Injury with Fall? 0 0   ?Risk for fall due to : No Fall Risks No Fall Risks No Fall  Risks  ?Follow up Falls prevention discussed Falls prevention discussed   ? ? ?FALL RISK PREVENTION PERTAINING TO THE HOME: ? ?Any stairs in or around the home? Yes  ?If so, are there any without handrails? No  ?Home free of loose throw rugs in walkways, pet beds, electrical cords, etc? Yes  ?Adequate lighting in your home to reduce risk of falls? Yes  ? ?ASSISTIVE DEVICES UTILIZED TO PREVENT FALLS: ? ?Life alert? No  ?Use of a cane, walker or w/c? No  ?Grab bars in the bathroom? Yes  ?Shower chair or bench in shower? No  ?Elevated toilet seat or a handicapped toilet? No  ? ?TIMED UP AND GO: ? ?Was the test performed? No . Telephonic visit. ? ?Cognitive Function: Cognitive status assessed by direct observation. Patient has current diagnosis of intellectual disability. Pt is followed by psych for ongoing evaluation.   ? ?  ?  ?  ? ?Immunizations ?Immunization History  ?Administered Date(s) Administered  ? Moderna Sars-Covid-2 Vaccination 07/18/2019, 08/15/2019  ? Tdap 05/21/2018  ? ? ?TDAP status: Up to date ? ?Flu Vaccine status: Declined, Education has been provided regarding the importance of this vaccine but patient still declined. Advised may receive this vaccine at local pharmacy or Health Dept. Aware to provide a copy of the vaccination record if obtained from local pharmacy or Health Dept. Verbalized acceptance and understanding. ? ?Pneumococcal vaccine status: Due, Education has been provided  regarding the importance of this vaccine. Advised may receive this vaccine at local pharmacy or Health Dept. Aware to provide a copy of the vaccination record if obtained from local pharmacy or Health D

## 2021-10-20 ENCOUNTER — Telehealth: Payer: Self-pay | Admitting: Nurse Practitioner

## 2021-10-20 NOTE — Telephone Encounter (Signed)
Thomas Forbes called and advised the patient doesn't have any medications to be refilled by PCP, all are psych medications on his profile other than Tylenol, which she says he doesn't need, and Vitamin D (refilled last in 2019). She verbalized understanding. ?

## 2021-10-20 NOTE — Telephone Encounter (Signed)
Medication Refill - Medication: All meds except pych meds ? ?Has the patient contacted their pharmacy? No. ?House manager christy Bruton called to request the refills.  Se said she did not know which they needed, I should have the list.  She then said all except pysch meds.  ?Her number [phone] ? ?Preferred Pharmacy (with phone number or street name): [facility] - [place], [place] - [address] ? ?Has the patient been seen for an appointment in the last year OR does the patient have an upcoming appointment? Yes.   ? ?HELPING HANDS ?Assisted Living, Mental Illness ?[place], [place] ?

## 2022-02-15 ENCOUNTER — Ambulatory Visit: Payer: Medicare Other | Admitting: Nurse Practitioner

## 2022-02-17 ENCOUNTER — Ambulatory Visit: Payer: Medicare Other | Admitting: Nurse Practitioner

## 2022-02-22 ENCOUNTER — Ambulatory Visit (INDEPENDENT_AMBULATORY_CARE_PROVIDER_SITE_OTHER): Payer: Medicare Other | Admitting: Nurse Practitioner

## 2022-02-22 ENCOUNTER — Other Ambulatory Visit: Payer: Self-pay

## 2022-02-22 ENCOUNTER — Encounter: Payer: Self-pay | Admitting: Nurse Practitioner

## 2022-02-22 VITALS — BP 124/82 | HR 96 | Temp 98.2°F | Resp 18 | Ht 66.5 in | Wt 231.5 lb

## 2022-02-22 DIAGNOSIS — F79 Unspecified intellectual disabilities: Secondary | ICD-10-CM | POA: Diagnosis not present

## 2022-02-22 DIAGNOSIS — Z79899 Other long term (current) drug therapy: Secondary | ICD-10-CM

## 2022-02-22 DIAGNOSIS — Z6837 Body mass index (BMI) 37.0-37.9, adult: Secondary | ICD-10-CM

## 2022-02-22 DIAGNOSIS — F201 Disorganized schizophrenia: Secondary | ICD-10-CM

## 2022-02-22 DIAGNOSIS — F319 Bipolar disorder, unspecified: Secondary | ICD-10-CM

## 2022-02-22 DIAGNOSIS — E785 Hyperlipidemia, unspecified: Secondary | ICD-10-CM

## 2022-02-22 NOTE — Assessment & Plan Note (Signed)
Patient lives in a group home called [facility].  Continue seeing Dr. Janeece Riggers at [facility] for his mental health.  Continue taking Zoloft 25 mg daily, risperidone 4 mg at night, risperidone 2 mg daily and has a standing order for Haldol for as needed use.

## 2022-02-22 NOTE — Assessment & Plan Note (Signed)
Patient lives in a group home called [facility].  Continue seeing Dr. Su at [facility] for his mental health.  Continue taking Zoloft 25 mg daily, risperidone 4 mg at night, risperidone 2 mg daily and has a standing order for Haldol for as needed use. 

## 2022-02-22 NOTE — Assessment & Plan Note (Signed)
Continue working on portion control, reducing saturated fats in your diet and getting physical activity. 

## 2022-02-22 NOTE — Assessment & Plan Note (Signed)
Continue working on portion control, reducing saturated fats in your diet and getting physical activity.

## 2022-02-22 NOTE — Assessment & Plan Note (Signed)
Patient lives in a group home called [facility].  [facility].  Continue taking Zoloft 25 mg daily, risperidone 4 mg at night, risperidone 2 mg daily and has a standing order for Haldol for as needed use. 

## 2022-02-22 NOTE — Progress Notes (Signed)
BP 124/82   Pulse 96   Temp 98.2 F (36.8 C) (Oral)   Resp 18   Ht 5' 6.5" (1.689 m)   Wt 231 lb 8 oz (105 kg)   SpO2 98%   BMI 36.81 kg/m    Subjective:    Patient ID: Thomas Forbes, male    DOB: 1986/02/09, 36 y.o.   MRN: [id number]  HPI: Thomas Forbes is a 36 y.o. male, here with health care aid  Chief Complaint  Patient presents with   Follow-up    6 month recheck    Bipolar/schizophrenia/intelectual disability: He lives in a group home called [facility] and has a [place]. He is currently under the care of Dr. Kasandra Knudsen at [facility] for his mental health.  Currently taking Zoloft 25 mg daily, risperidone 4 mg at night, risperidone 2 mg daily and has a standing order for Haldol for as needed use.   Obesity: Says he does walk around a lot.  His weight today is 231 lbs with a BMI of 36.81.  He says he does not really have much control over what he eats since he lives in a group home and is to eat with a provide.  Hyperlipidemia: His last LDL was 105 on 08/16/2021.  He is not currently on cholesterol-lowering medication.  Recommendations are to reduce saturated fats in his diet.  Relevant past medical, surgical, family and social history reviewed and updated as indicated. Interim medical history since our last visit reviewed. Allergies and medications reviewed and updated.  Review of Systems  Constitutional: Negative for fever or weight change.  Respiratory: Negative for cough and shortness of breath.   Cardiovascular: Negative for chest pain or palpitations.  Gastrointestinal: Negative for abdominal pain, no bowel changes.  Musculoskeletal: Negative for gait problem or joint swelling.  Skin: Negative for rash.  Neurological: Negative for dizziness or headache.  No other specific complaints in a complete review of systems (except as listed in HPI above).      Objective:    BP 124/82   Pulse  96   Temp 98.2 F (36.8 C) (Oral)   Resp 18   Ht 5' 6.5" (1.689 m)   Wt 231 lb 8 oz (105 kg)   SpO2 98%   BMI 36.81 kg/m   Wt Readings from Last 3 Encounters:  02/22/22 231 lb 8 oz (105 kg)  08/16/21 232 lb 14.4 oz (105.6 kg)  10/06/20 242 lb (109.8 kg)    Physical Exam  Constitutional: Patient appears well-developed and well-nourished. Obese  No distress.  HEENT: head atraumatic, normocephalic, pupils equal and reactive to light, ears TMs clear, neck supple, throat within normal limits Cardiovascular: Normal rate, regular rhythm and normal heart sounds.  No murmur heard. No BLE edema. Pulmonary/Chest: Effort normal and breath sounds normal. No respiratory distress. Abdominal: Soft.  There is no tenderness. Psychiatric: Patient has a normal mood and affect. behavior is normal. Judgment and thought content normal.   Results for orders placed or performed in visit on 08/16/21  CBC with Differential/Platelet  Result Value Ref Range   WBC 5.2 3.8 - 10.8 Thousand/uL   RBC 4.74 4.20 - 5.80 Million/uL   Hemoglobin 13.3 13.2 - 17.1 g/dL   HCT 42.2 38.5 - 50.0 %   MCV 89.0 80.0 - 100.0 fL   MCH 28.1 27.0 - 33.0 pg   MCHC 31.5 (L) 32.0 - 36.0 g/dL   RDW 11.2 11.0 - 15.0 %  Platelets 216 140 - 400 Thousand/uL   MPV 11.8 7.5 - 12.5 fL   Neutro Abs 1,882 1,500 - 7,800 cells/uL   Lymphs Abs 2,829 850 - 3,900 cells/uL   Absolute Monocytes 390 200 - 950 cells/uL   Eosinophils Absolute 78 15 - 500 cells/uL   Basophils Absolute 21 0 - 200 cells/uL   Neutrophils Relative % 36.2 %   Total Lymphocyte 54.4 %   Monocytes Relative 7.5 %   Eosinophils Relative 1.5 %   Basophils Relative 0.4 %  COMPLETE METABOLIC PANEL WITH GFR  Result Value Ref Range   Glucose, Bld 81 65 - 99 mg/dL   BUN 9 7 - 25 mg/dL   Creat 1.18 0.60 - 1.26 mg/dL   eGFR 82 > OR = 60 mL/min/1.21m   BUN/Creatinine Ratio NOT APPLICABLE 6 - 22 (calc)   Sodium 140 135 - 146 mmol/L   Potassium 4.5 3.5 - 5.3 mmol/L    Chloride 104 98 - 110 mmol/L   CO2 28 20 - 32 mmol/L   Calcium 9.1 8.6 - 10.3 mg/dL   Total Protein 6.3 6.1 - 8.1 g/dL   Albumin 4.3 3.6 - 5.1 g/dL   Globulin 2.0 1.9 - 3.7 g/dL (calc)   AG Ratio 2.2 1.0 - 2.5 (calc)   Total Bilirubin 0.4 0.2 - 1.2 mg/dL   Alkaline phosphatase (APISO) 65 36 - 130 U/L   AST 11 10 - 40 U/L   ALT 17 9 - 46 U/L  Lipid panel  Result Value Ref Range   Cholesterol 168 <200 mg/dL   HDL 35 (L) > OR = 40 mg/dL   Triglycerides 167 (H) <150 mg/dL   LDL Cholesterol (Calc) 105 (H) mg/dL (calc)   Total CHOL/HDL Ratio 4.8 <5.0 (calc)   Non-HDL Cholesterol (Calc) 133 (H) <130 mg/dL (calc)      Assessment & Plan:   Problem List Items Addressed This Visit       Other   Intellectual disability    Patient lives in a group home called [facility].  [facility].  Continue taking Zoloft 25 mg daily, risperidone 4 mg at night, risperidone 2 mg daily and has a standing order for Haldol for as needed use.      Relevant Orders   CBC with Differential/Platelet   COMPLETE METABOLIC PANEL WITH GFR   Schizophrenia ([place]    Patient lives in a group home called [facility].  [facility]  Continue taking Zoloft 25 mg daily, risperidone 4 mg at night, risperidone 2 mg daily and has a standing order for Haldol for as needed use.      Relevant Orders   CBC with Differential/Platelet   COMPLETE METABOLIC PANEL WITH GFR   Bipolar 1 disorder ([place] [facility]    Patient lives in a group home called [facility].  [facility].  Continue taking Zoloft 25 mg daily, risperidone 4 mg at night, risperidone 2 mg daily and has a standing order for Haldol for as needed use.      Relevant Orders   CBC with Differential/Platelet    COMPLETE METABOLIC PANEL WITH GFR   Class 2 severe obesity due to excess calories with serious comorbidity and body mass index (BMI) of 37.0 to 37.9 in adult (  [place])    Continue working on portion control, reducing saturated fats in your diet and getting physical activity.      Relevant Orders   Lipid panel   CBC with Differential/Platelet   COMPLETE METABOLIC PANEL WITH GFR   Hyperlipidemia    Continue working on portion control, reducing saturated fats in your diet and getting physical activity.      Relevant Orders   Lipid panel   High risk medication use    Getting labs      Relevant Orders   CBC with Differential/Platelet   COMPLETE METABOLIC PANEL WITH GFR     Follow up plan: Return in about 6 months (around 08/23/2022) for follow up.

## 2022-02-22 NOTE — Assessment & Plan Note (Signed)
Getting labs 

## 2022-02-23 LAB — LIPID PANEL
Cholesterol: 173 mg/dL (ref <200)
HDL: 39 mg/dL — ABNORMAL LOW (ref >=40)
LDL Cholesterol (Calc): 107 mg/dL (calc) — ABNORMAL HIGH
Non-HDL Cholesterol (Calc): 134 mg/dL (calc) — ABNORMAL HIGH (ref <130)
Total CHOL/HDL Ratio: 4.4 (calc) (ref <5.0)
Triglycerides: 154 mg/dL — ABNORMAL HIGH (ref <150)

## 2022-02-23 LAB — COMPLETE METABOLIC PANEL WITH GFR
AG Ratio: 2 (calc) (ref 1.0–2.5)
ALT: 15 U/L (ref 9–46)
AST: 13 U/L (ref 10–40)
Albumin: 4.1 g/dL (ref 3.6–5.1)
Alkaline phosphatase (APISO): 64 U/L (ref 36–130)
BUN: 10 mg/dL (ref 7–25)
CO2: 26 mmol/L (ref 20–32)
Calcium: 9.2 mg/dL (ref 8.6–10.3)
Chloride: 106 mmol/L (ref 98–110)
Creat: 1.12 mg/dL (ref 0.60–1.26)
Globulin: 2.1 g/dL (calc) (ref 1.9–3.7)
Glucose, Bld: 97 mg/dL (ref 65–99)
Potassium: 4 mmol/L (ref 3.5–5.3)
Sodium: 139 mmol/L (ref 135–146)
Total Bilirubin: 0.4 mg/dL (ref 0.2–1.2)
Total Protein: 6.2 g/dL (ref 6.1–8.1)
eGFR: 87 mL/min/{1.73_m2} (ref >=60)

## 2022-02-23 LAB — CBC WITH DIFFERENTIAL/PLATELET
Absolute Monocytes: 393 cells/uL (ref 200–950)
Basophils Absolute: 20 cells/uL (ref 0–200)
Basophils Relative: 0.4 %
Eosinophils Absolute: 41 cells/uL (ref 15–500)
Eosinophils Relative: 0.8 %
HCT: 40.8 % (ref 38.5–50.0)
Hemoglobin: 13.3 g/dL (ref 13.2–17.1)
Lymphs Abs: 2453 cells/uL (ref 850–3900)
MCH: 29 pg (ref 27.0–33.0)
MCHC: 32.6 g/dL (ref 32.0–36.0)
MCV: 88.9 fL (ref 80.0–100.0)
MPV: 11.8 fL (ref 7.5–12.5)
Monocytes Relative: 7.7 %
Neutro Abs: 2193 cells/uL (ref 1500–7800)
Neutrophils Relative %: 43 %
Platelets: 229 10*3/uL (ref 140–400)
RBC: 4.59 10*6/uL (ref 4.20–5.80)
RDW: 11.2 % (ref 11.0–15.0)
Total Lymphocyte: 48.1 %
WBC: 5.1 10*3/uL (ref 3.8–10.8)

## 2022-08-10 ENCOUNTER — Telehealth: Payer: Self-pay | Admitting: Nurse Practitioner

## 2022-08-10 NOTE — Telephone Encounter (Signed)
Contacted Treson Curcio to schedule their annual wellness visit. Appointment made for 10/19/2022.  [place] Direct Dial: [phone]

## 2022-08-23 ENCOUNTER — Ambulatory Visit: Payer: Medicare Other | Admitting: Nurse Practitioner

## 2022-08-31 ENCOUNTER — Ambulatory Visit: Payer: Medicare Other | Admitting: Nurse Practitioner

## 2022-09-13 ENCOUNTER — Ambulatory Visit: Payer: Medicare Other | Admitting: Nurse Practitioner

## 2022-10-11 ENCOUNTER — Ambulatory Visit: Payer: Medicare Other | Admitting: Nurse Practitioner

## 2022-10-11 NOTE — Progress Notes (Deleted)
There were no vitals taken for this visit.   Subjective:    Patient ID: Thomas Forbes, male    DOB: 1986/01/17, 37 y.o.   MRN: [id number]  HPI: Thomas Forbes is a 37 y.o. male, here with health care aid  No chief complaint on file.   Bipolar/schizophrenia/intelectual disability: He lives in a group home called [facility] and has a guardian-[place]. He is currently under the care of Dr. Janeece Riggers at [facility] for his mental health.  Currently taking Zoloft 25 mg daily, risperidone 4 mg at night, risperidone 2 mg daily and has a standing order for Haldol for as needed use. No changes.   Obesity: Reports he does do a lot of walking.  His weight today is *** with a BMI of ***.  Visit his weight was 230 pounds.  Patient lives in a group home that has little control over when he gets to eat.  Comorbidities include hyperlipidemia.  Hyperlipidemia: Last LDL was 107 on 02/22/2022.  He is not currently on cholesterol-lowering medication.  We had discussed reducing saturated fats in his diet.  Relevant past medical, surgical, family and social history reviewed and updated as indicated. Interim medical history since our last visit reviewed. Allergies and medications reviewed and updated.  Review of Systems  Constitutional: Negative for fever or weight change.  Respiratory: Negative for cough and shortness of breath.   Cardiovascular: Negative for chest pain or palpitations.  Gastrointestinal: Negative for abdominal pain, no bowel changes.  Musculoskeletal: Negative for gait problem or joint swelling.  Skin: Negative for rash.  Neurological: Negative for dizziness or headache.  No other specific complaints in a complete review of systems (except as listed in HPI above).      Objective:    There were no vitals taken for this visit.  Wt Readings from Last 3 Encounters:  02/22/22 231 lb 8 oz (105 kg)  08/16/21 232 lb 14.4 oz  (105.6 kg)  10/06/20 242 lb (109.8 kg)    Physical Exam  Constitutional: Patient appears well-developed and well-nourished. Obese  No distress.  HEENT: head atraumatic, normocephalic, pupils equal and reactive to light, ears TMs clear, neck supple, throat within normal limits Cardiovascular: Normal rate, regular rhythm and normal heart sounds.  No murmur heard. No BLE edema. Pulmonary/Chest: Effort normal and breath sounds normal. No respiratory distress. Abdominal: Soft.  There is no tenderness. Psychiatric: Patient has a normal mood and affect. behavior is normal. Judgment and thought content normal.   Results for orders placed or performed in visit on 02/22/22  Lipid panel  Result Value Ref Range   Cholesterol 173 <200 mg/dL   HDL 39 (L) > OR = 40 mg/dL   Triglycerides 295 (H) <150 mg/dL   LDL Cholesterol (Calc) 107 (H) mg/dL (calc)   Total CHOL/HDL Ratio 4.4 <5.0 (calc)   Non-HDL Cholesterol (Calc) 134 (H) <130 mg/dL (calc)  CBC with Differential/Platelet  Result Value Ref Range   WBC 5.1 3.8 - 10.8 Thousand/uL   RBC 4.59 4.20 - 5.80 Million/uL   Hemoglobin 13.3 13.2 - 17.1 g/dL   HCT 28.4 13.2 - 44.0 %   MCV 88.9 80.0 - 100.0 fL   MCH 29.0 27.0 - 33.0 pg   MCHC 32.6 32.0 - 36.0 g/dL   RDW 10.2 72.5 - 36.6 %   Platelets 229 140 - 400 Thousand/uL   MPV 11.8 7.5 - 12.5 fL   Neutro Abs 2,193 1,500 - 7,800  cells/uL   Lymphs Abs 2,453 850 - 3,900 cells/uL   Absolute Monocytes 393 200 - 950 cells/uL   Eosinophils Absolute 41 15 - 500 cells/uL   Basophils Absolute 20 0 - 200 cells/uL   Neutrophils Relative % 43 %   Total Lymphocyte 48.1 %   Monocytes Relative 7.7 %   Eosinophils Relative 0.8 %   Basophils Relative 0.4 %  COMPLETE METABOLIC PANEL WITH GFR  Result Value Ref Range   Glucose, Bld 97 65 - 99 mg/dL   BUN 10 7 - 25 mg/dL   Creat 1.61 0.96 - 0.45 mg/dL   eGFR 87 > OR = 60 [id number]/[id number]   BUN/Creatinine Ratio SEE NOTE: 6 - 22 (calc)   Sodium 139 135 - 146 mmol/L    Potassium 4.0 3.5 - 5.3 mmol/L   Chloride 106 98 - 110 mmol/L   CO2 26 20 - 32 mmol/L   Calcium 9.2 8.6 - 10.3 mg/dL   Total Protein 6.2 6.1 - 8.1 g/dL   Albumin 4.1 3.6 - 5.1 g/dL   Globulin 2.1 1.9 - 3.7 g/dL (calc)   AG Ratio 2.0 1.0 - 2.5 (calc)   Total Bilirubin 0.4 0.2 - 1.2 mg/dL   Alkaline phosphatase (APISO) 64 36 - 130 U/L   AST 13 10 - 40 U/L   ALT 15 9 - 46 U/L      Assessment & Plan:   Problem List Items Addressed This Visit   None    Follow up plan: No follow-ups on file.

## 2022-10-19 ENCOUNTER — Ambulatory Visit (INDEPENDENT_AMBULATORY_CARE_PROVIDER_SITE_OTHER): Payer: Medicare Other

## 2022-10-19 VITALS — BP 124/72 | Ht 66.5 in | Wt 235.4 lb

## 2022-10-19 DIAGNOSIS — Z0000 Encounter for general adult medical examination without abnormal findings: Secondary | ICD-10-CM | POA: Diagnosis not present

## 2022-10-19 NOTE — Progress Notes (Signed)
Subjective:   Thomas Forbes is a 37 y.o. male who presents for Medicare Annual/Subsequent preventive examination.  Review of Systems    Cardiac Risk Factors include: dyslipidemia    Objective:    Today's Vitals   10/19/22 1313  BP: 124/72  Weight: 235 lb 6.4 oz (106.8 kg)  Height: 5' 6.5" (1.689 m)   Body mass index is 37.43 kg/m.     10/19/2022    1:47 PM 06/27/2021    1:32 PM 05/10/2017    8:44 PM 03/26/2017    3:38 PM 02/18/2017    7:37 PM  Advanced Directives  Does Patient Have a Medical Advance Directive? No Unable to assess, patient is non-responsive or altered mental status No No No  Would patient like information on creating a medical advance directive?     No - Patient declined    Current Medications (verified) Outpatient Encounter Medications as of 10/19/2022  Medication Sig   acetaminophen (TYLENOL) 500 MG tablet Take 500 mg by mouth every 6 (six) hours as needed.   benztropine (COGENTIN) 2 MG tablet Take 2 mg by mouth at bedtime.   Cholecalciferol (VITAMIN D) 2000 units tablet Take 2,000 Units by mouth daily.   haloperidol decanoate (HALDOL DECANOATE) 100 MG/ML injection    risperiDONE (RISPERDAL) 2 MG tablet Take 2 mg by mouth daily.   risperidone (RISPERDAL) 4 MG tablet Take 4 mg by mouth at bedtime.   sertraline (ZOLOFT) 25 MG tablet    No facility-administered encounter medications on file as of 10/19/2022.    Allergies (verified) Patient has no known allergies.   History: Past Medical History:  Diagnosis Date   Bipolar 1 disorder (HCC)    Chronic disorganized schizophrenia (HCC)    Schizophrenia (HCC)    No past surgical history on file. No family history on file. Social History   Socioeconomic History   Marital status: Single    Spouse name: Not on file   Number of children: Not on file   Years of education: Not on file   Highest education level: 10th grade  Occupational History   Not on file  Tobacco Use   Smoking status: Former     Types: Cigarettes    Quit date: 08/18/2017    Years since quitting: 5.1   Smokeless tobacco: Never   Tobacco comments:    intermittent cigarettes - smoked from friends  Vaping Use   Vaping Use: Never used  Substance and Sexual Activity   Alcohol use: No   Drug use: No   Sexual activity: Not on file  Other Topics Concern   Not on file  Social History Narrative   Not on file   Social Determinants of Health   Financial Resource Strain: Low Risk  (10/11/2021)   Overall Financial Resource Strain (CARDIA)    Difficulty of Paying Living Expenses: Not hard at all  Food Insecurity: No Food Insecurity (10/11/2021)   Hunger Vital Sign    Worried About Running Out of Food in the Last Year: Never true    Ran Out of Food in the Last Year: Never true  Transportation Needs: No Transportation Needs (10/19/2022)   PRAPARE - Administrator, Civil Service (Medical): No    Lack of Transportation (Non-Medical): No  Physical Activity: Inactive (10/19/2022)   Exercise Vital Sign    Days of Exercise per Week: 0 days    Minutes of Exercise per Session: 0 min  Stress: No Stress Concern Present (10/19/2022)   [place]  [employer] - Occupational Stress Questionnaire    Feeling of Stress : Not at all  Social Connections: Socially Isolated (10/19/2022)   Social Connection and Isolation Panel [NHANES]    Frequency of Communication with Friends and Family: Once a week    Frequency of Social Gatherings with Friends and Family: Twice a week    Attends Religious Services: Never    Diplomatic Services operational officer: No    Attends Engineer, structural: Never    Marital Status: Never married    Tobacco Counseling Counseling given: Not Answered Tobacco comments: intermittent cigarettes - smoked from friends  Clinical Intake:  Pre-visit preparation completed: No  Pain : No/denies pain   BMI - recorded: 37.43 Nutritional Status: BMI > 30  Obese Nutritional Risks:  None Diabetes: No  How often do you need to have someone help you when you read instructions, pamphlets, or other written materials from your doctor or pharmacy?: 1 - Never  Diabetic?no  Interpreter Needed?: No  Comments: lives in group home Information entered by :: B.Tariyah Pendry,LPN   Activities of Daily Living    10/19/2022    1:20 PM 02/22/2022    1:01 PM  In your present state of health, do you have any difficulty performing the following activities:  Hearing? 0 0  Vision? 0 0  Difficulty concentrating or making decisions? 0 0  Walking or climbing stairs? 0 0  Dressing or bathing? 0 0  Doing errands, shopping? 0 0  Preparing Food and eating ? N   Using the Toilet? N   In the past six months, have you accidently leaked urine? N   Do you have problems with loss of bowel control? N   Managing your Medications? Y   Managing your Finances? Y   Housekeeping or managing your Housekeeping? Y     Patient Care Team: Berniece Salines, FNP as PCP - General (Nurse Practitioner)  Indicate any recent Medical Services you may have received from other than [facility] providers in the past year (date may be approximate).     Assessment:   This is a routine wellness examination for Shivansh.  Hearing/Vision screen Hearing Screening - Comments:: Adequate hearing Vision Screening - Comments:: Vision adequate [facility]  Dietary issues and exercise activities discussed: Current Exercise Habits: The patient does not participate in regular exercise at present, Exercise limited by: psychological condition(s)   Goals Addressed   None    Depression Screen    10/19/2022    1:17 PM 02/22/2022    1:02 PM 10/11/2021    3:39 PM 08/16/2021    1:25 PM 10/06/2020    9:40 AM  PHQ 2/9 Scores  PHQ - 2 Score 0 0 0 0 0  PHQ- 9 Score    0 0    Fall Risk    10/19/2022    1:16 PM 02/22/2022    1:01 PM 10/11/2021    3:41 PM 08/16/2021    1:25 PM 09/16/2019   10:22 AM  Fall Risk   Falls in the past year? 0  0 0 0 0  Number falls in past yr: 0 0 0 0 0  Injury with Fall? 0 0 0 0   Risk for fall due to : No Fall Risks  No Fall Risks No Fall Risks No Fall Risks  Follow up Education provided;Falls prevention discussed Falls evaluation completed Falls prevention discussed Falls prevention discussed     FALL RISK PREVENTION PERTAINING TO THE  HOME:  Any stairs in or around the home? No  If so, are there any without handrails? No  Home free of loose throw rugs in walkways, pet beds, electrical cords, etc? Yes  Adequate lighting in your home to reduce risk of falls? Yes   ASSISTIVE DEVICES UTILIZED TO PREVENT FALLS:  Life alert? No  Use of a cane, walker or w/c? No  Grab bars in the bathroom? No  Shower chair or bench in shower? No  Elevated toilet seat or a handicapped toilet? Yes   Cognitive Function:        10/19/2022    1:21 PM  6CIT Screen  What Year? 0 points  What month? 0 points  What time? 0 points  Count back from 20 0 points  Months in reverse 4 points  Repeat phrase 6 points  Total Score 10 points    Immunizations Immunization History  Administered Date(s) Administered   Moderna Sars-Covid-2 Vaccination 07/18/2019, 08/15/2019   Tdap 05/21/2018    TDAP status: Up to date  Flu Vaccine status: Declined, Education has been provided regarding the importance of this vaccine but patient still declined. Advised may receive this vaccine at local pharmacy or Health Dept. Aware to provide a copy of the vaccination record if obtained from local pharmacy or Health Dept. Verbalized acceptance and understanding.  Pneumococcal vaccine status: Declined,  Education has been provided regarding the importance of this vaccine but patient still declined. Advised may receive this vaccine at local pharmacy or Health Dept. Aware to provide a copy of the vaccination record if obtained from local pharmacy or Health Dept. Verbalized acceptance and understanding.   Covid-19 vaccine status:  Completed vaccines  Qualifies for Shingles Vaccine? Yes   Zostavax completed No   Shingrix Completed?: No.    Education has been provided regarding the importance of this vaccine. Patient has been advised to call insurance company to determine out of pocket expense if they have not yet received this vaccine. Advised may also receive vaccine at local pharmacy or Health Dept. Verbalized acceptance and understanding.  Screening Tests Health Maintenance  Topic Date Due   HIV Screening  Never done   Hepatitis C Screening  Never done   COVID-19 Vaccine (3 - Moderna risk series) 09/12/2019   INFLUENZA VACCINE  01/11/2023   Medicare Annual Wellness (AWV)  10/19/2023   DTaP/Tdap/Td (2 - Td or Tdap) 05/21/2028   HPV VACCINES  Aged Out    Health Maintenance  Health Maintenance Due  Topic Date Due   HIV Screening  Never done   Hepatitis C Screening  Never done   COVID-19 Vaccine (3 - Moderna risk series) 09/12/2019    Lung Cancer Screening: (Low Dose CT Chest recommended if Age 107-80 years, 30 pack-year currently smoking OR have quit w/in 15years.) does not qualify.   Lung Cancer Screening Referral: no  Additional Screening:  Hepatitis C Screening: does not qualify; Completed yes  Vision Screening: Recommended annual ophthalmology exams for early detection of glaucoma and other disorders of the eye. Is the patient up to date with their annual eye exam?  Yes  Who is the provider or what is the name of the office in which the patient attends annual eye exams? [facility] If pt is not established with a provider, would they like to be referred to a provider to establish care? No .   Dental Screening: Recommended annual dental exams for proper oral hygiene  Community Resource Referral / Chronic Care Management: CRR  required this visit?  No   CCM required this visit?  No    Plan:     I have personally reviewed and noted the following in the patient's chart:   Medical and social  history Use of alcohol, tobacco or illicit drugs  Current medications and supplements including opioid prescriptions. Patient is not currently taking opioid prescriptions. Functional ability and status Nutritional status Physical activity Advanced directives List of other physicians Hospitalizations, surgeries, and ER visits in previous 12 months Vitals Screenings to include cognitive, depression, and falls Referrals and appointments  In addition, I have reviewed and discussed with patient certain preventive protocols, quality metrics, and best practice recommendations. A written personalized care plan for preventive services as well as general preventive health recommendations were provided to patient.     Sue Lush, LPN   10/17/8467   Nurse Notes: pt presents to clinic with worker from [facility]. Pt able to answer the questions. He has no complaints or questions.

## 2022-10-19 NOTE — Patient Instructions (Signed)
Thomas Forbes , Thank you for taking time to come for your Medicare Wellness Visit. I appreciate your ongoing commitment to your health goals. Please review the following plan we discussed and let me know if I can assist you in the future.   These are the goals we discussed:  Goals   None     This is a list of the screening recommended for you and due dates:  Health Maintenance  Topic Date Due   HIV Screening  Never done   Hepatitis C Screening: USPSTF Recommendation to screen - Ages 51-79 yo.  Never done   COVID-19 Vaccine (3 - Moderna risk series) 09/12/2019   Flu Shot  01/11/2023   Medicare Annual Wellness Visit  10/19/2023   DTaP/Tdap/Td vaccine (2 - Td or Tdap) 05/21/2028   HPV Vaccine  Aged Out    Advanced directives: no  Conditions/risks identified: none  Next appointment: Follow up in one year for your annual wellness visit 10/25/2023 @1 :30pm in person  Preventive Care 38-38 Years Old, Male Preventive care refers to lifestyle choices and visits with your health care provider that can promote health and wellness. Preventive care visits are also called wellness exams. What can I expect for my preventive care visit? Counseling During your preventive care visit, your health care provider may ask about your: Medical history, including: Past medical problems. Family medical history. Current health, including: Emotional well-being. Home life and relationship well-being. Sexual activity. Lifestyle, including: Alcohol, nicotine or tobacco, and drug use. Access to firearms. Diet, exercise, and sleep habits. Safety issues such as seatbelt and bike helmet use. Sunscreen use. Work and work Astronomer. Physical exam Your health care provider may check your: Height and weight. These may be used to calculate your BMI (body mass index). BMI is a measurement that tells if you are at a healthy weight. Waist circumference. This measures the distance around your waistline. This  measurement also tells if you are at a healthy weight and may help predict your risk of certain diseases, such as type 2 diabetes and high blood pressure. Heart rate and blood pressure. Body temperature. Skin for abnormal spots. What immunizations do I need? Vaccines are usually given at various ages, according to a schedule. Your health care provider will recommend vaccines for you based on your age, medical history, and lifestyle or other factors, such as travel or where you work. What tests do I need? Screening Your health care provider may recommend screening tests for certain conditions. This may include: Lipid and cholesterol levels. Diabetes screening. This is done by checking your blood sugar (glucose) after you have not eaten for a while (fasting). Hepatitis B test. Hepatitis C test. HIV (human immunodeficiency virus) test. STI (sexually transmitted infection) testing, if you are at risk. Talk with your health care provider about your test results, treatment options, and if necessary, the need for more tests. Follow these instructions at home: Eating and drinking  Eat a healthy diet that includes fresh fruits and vegetables, whole grains, lean protein, and low-fat dairy products. Drink enough fluid to keep your urine pale yellow. Take vitamin and mineral supplements as recommended by your health care provider. Do not drink alcohol if your health care provider tells you not to drink. If you drink alcohol: Limit how much you have to 0-2 drinks a day. Know how much alcohol is in your drink. In the [place] one drink equals one 12 oz bottle of beer (355 mL), one 5 oz glass of wine (148  mL), or one 1 oz glass of hard liquor (44 mL). Lifestyle Brush your teeth every morning and night with fluoride toothpaste. Floss one time each day. Exercise for at least 30 minutes 5 or more days each week. Do not use any products that contain nicotine or tobacco. These products include cigarettes,  chewing tobacco, and vaping devices, such as e-cigarettes. If you need help quitting, ask your health care provider. Do not use drugs. If you are sexually active, practice safe sex. Use a condom or other form of protection to prevent STIs. Find healthy ways to manage stress, such as: Meditation, yoga, or listening to music. Journaling. Talking to a trusted person. Spending time with friends and family. Minimize exposure to UV radiation to reduce your risk of skin cancer. Safety Always wear your seat belt while driving or riding in a vehicle. Do not drive: If you have been drinking alcohol. Do not ride with someone who has been drinking. If you have been using any mind-altering substances or drugs. While texting. When you are tired or distracted. Wear a helmet and other protective equipment during sports activities. If you have firearms in your house, make sure you follow all gun safety procedures. Seek help if you have been physically or sexually abused. What's next? Go to your health care provider once a year for an annual wellness visit. Ask your health care provider how often you should have your eyes and teeth checked. Stay up to date on all vaccines. This information is not intended to replace advice given to you by your health care provider. Make sure you discuss any questions you have with your health care provider. Document Revised: 11/24/2020 Document Reviewed: 11/24/2020 Elsevier Patient Education  2022 [employer].

## 2022-11-01 ENCOUNTER — Telehealth: Payer: Self-pay | Admitting: Nurse Practitioner

## 2022-11-01 NOTE — Telephone Encounter (Signed)
Copied from CRM [id number]. Topic: General - Other >> Nov 01, 2022  9:44 AM Turkey B wrote: Reason for CRM: Neysa Bonito, worker for pt called in needs FL2 form renewed.

## 2022-11-01 NOTE — Telephone Encounter (Signed)
1 year appoinment needed

## 2022-11-01 NOTE — Telephone Encounter (Signed)
Last seen by Raynelle Fanning Sept 2023. Saw Brenda on 5.9.2024. Tried to explain that he will need an appointment for the FL-2 and she stated that he was seen. I explained that he saw the nurse health advisor not his pcp. She then stated she has never had this problem and that he did in fact saw the pcp. I asked her to fill out her portion and fax it in. She stated she already have emailed it in. Do this pt need an appointment to get the fl-2 form filled out?

## 2022-11-01 NOTE — Telephone Encounter (Signed)
Appt sch'd fr 6.10.2024

## 2022-11-20 ENCOUNTER — Other Ambulatory Visit: Payer: Self-pay

## 2022-11-20 ENCOUNTER — Encounter: Payer: Self-pay | Admitting: Nurse Practitioner

## 2022-11-20 ENCOUNTER — Ambulatory Visit (INDEPENDENT_AMBULATORY_CARE_PROVIDER_SITE_OTHER): Payer: Medicare Other | Admitting: Nurse Practitioner

## 2022-11-20 VITALS — BP 120/72 | HR 97 | Temp 97.8°F | Resp 18 | Ht 66.5 in | Wt 234.3 lb

## 2022-11-20 DIAGNOSIS — Z131 Encounter for screening for diabetes mellitus: Secondary | ICD-10-CM

## 2022-11-20 DIAGNOSIS — Z79899 Other long term (current) drug therapy: Secondary | ICD-10-CM

## 2022-11-20 DIAGNOSIS — Z114 Encounter for screening for human immunodeficiency virus [HIV]: Secondary | ICD-10-CM

## 2022-11-20 DIAGNOSIS — F319 Bipolar disorder, unspecified: Secondary | ICD-10-CM | POA: Diagnosis not present

## 2022-11-20 DIAGNOSIS — F201 Disorganized schizophrenia: Secondary | ICD-10-CM

## 2022-11-20 DIAGNOSIS — Z6837 Body mass index (BMI) 37.0-37.9, adult: Secondary | ICD-10-CM

## 2022-11-20 DIAGNOSIS — Z130 Encounter for screening for diseases of the blood and blood-forming organs and certain disorders involving the immune mechanism: Secondary | ICD-10-CM

## 2022-11-20 DIAGNOSIS — Z1159 Encounter for screening for other viral diseases: Secondary | ICD-10-CM

## 2022-11-20 DIAGNOSIS — E785 Hyperlipidemia, unspecified: Secondary | ICD-10-CM

## 2022-11-20 DIAGNOSIS — Z0289 Encounter for other administrative examinations: Secondary | ICD-10-CM

## 2022-11-20 DIAGNOSIS — Z0000 Encounter for general adult medical examination without abnormal findings: Secondary | ICD-10-CM

## 2022-11-20 NOTE — Progress Notes (Signed)
Name: Thomas Forbes   MRN: [id number]    DOB: 1986/02/14   Date:11/20/2022       Progress Note  Subjective  Chief Complaint  Chief Complaint  Patient presents with   Annual Exam    FL2    HPI  Patient presents for annual CPE.   Diet: well balanced diet Exercise: has not lately but used to play basketball Sleep: sleeps well Last dental exam:2 months ago Last eye exam: over a year  Depression: phq 9 is negative    11/20/2022    2:45 PM 10/19/2022    1:17 PM 02/22/2022    1:02 PM 10/11/2021    3:39 PM 08/16/2021    1:25 PM  Depression screen PHQ 2/9  Decreased Interest 0 0 0 0 0  Down, Depressed, Hopeless 0 0 0 0 0  PHQ - 2 Score 0 0 0 0 0  Altered sleeping     0  Tired, decreased energy     0  Change in appetite     0  Feeling bad or failure about yourself      0  Trouble concentrating     0  Moving slowly or fidgety/restless     0  Suicidal thoughts     0  PHQ-9 Score     0  Difficult doing work/chores     Not difficult at all    Hypertension:  BP Readings from Last 3 Encounters:  11/20/22 120/72  10/19/22 124/72  02/22/22 124/82    Obesity: Wt Readings from Last 3 Encounters:  11/20/22 234 lb 4.8 oz (106.3 kg)  10/19/22 235 lb 6.4 oz (106.8 kg)  02/22/22 231 lb 8 oz (105 kg)   BMI Readings from Last 3 Encounters:  11/20/22 37.25 kg/m  10/19/22 37.43 kg/m  02/22/22 36.81 kg/m     Lipids:  Lab Results  Component Value Date   CHOL 173 02/22/2022   CHOL 168 08/16/2021   CHOL 194 10/06/2020   Lab Results  Component Value Date   HDL 39 (L) 02/22/2022   HDL 35 (L) 08/16/2021   HDL 42 10/06/2020   Lab Results  Component Value Date   LDLCALC 107 (H) 02/22/2022   LDLCALC 105 (H) 08/16/2021   LDLCALC 126 (H) 10/06/2020   Lab Results  Component Value Date   TRIG 154 (H) 02/22/2022   TRIG 167 (H) 08/16/2021   TRIG 145 10/06/2020   Lab Results  Component Value Date   CHOLHDL 4.4 02/22/2022   CHOLHDL 4.8 08/16/2021   CHOLHDL 4.6 10/06/2020    No results found for: "LDLDIRECT" Glucose:  Glucose, Bld  Date Value Ref Range Status  02/22/2022 97 65 - 99 mg/dL Final    Comment:    .            Fasting reference interval .   08/16/2021 81 65 - 99 mg/dL Final    Comment:    .            Fasting reference interval .   10/06/2020 96 65 - 99 mg/dL Final    Comment:    .            Fasting reference interval .    Glucose-Capillary  Date Value Ref Range Status  03/26/2017 116 (H) 65 - 99 mg/dL Final    Flowsheet Row Office Visit from 11/20/2022 in [facility]  AUDIT-C Score 0       Single STD testing and prevention (  HIV/chl/gon/syphilis): due Hep C: due  Skin cancer: Discussed monitoring for atypical lesions Colorectal cancer: no concerns,does not qualify Prostate cancer: : no concerns,does not qualify No results found for: "PSA"   Lung cancer:   Low Dose CT Chest recommended if Age 63-80 years, 30 pack-year currently smoking OR have quit w/in 15years. Patient does not qualify.   AAA:  The USPSTF recommends one-time screening with ultrasonography in men ages 18 to 71 years who have ever smoked ECG:  03/27/2017  Vaccines:  HPV: up to at age 33 , ask insurance if age between 40-45  Shingrix: 30-64 yo and ask insurance if covered when patient above 45 yo Pneumonia:  educated and discussed with patient. Flu:  educated and discussed with patient.  Advanced Care Planning: A voluntary discussion about advance care planning including the explanation and discussion of advance directives.  Discussed health care proxy and Living will, and the patient was able to identify a health care proxy as unknown.  Patient does not have a living will at present time. If patient does have living will, I have requested they bring this to the clinic to be scanned in to their chart.  Patient Active Problem List   Diagnosis Date Noted   Class 2 severe obesity due to excess calories with serious comorbidity  and body mass index (BMI) of 37.0 to 37.9 in adult [facility]) 08/16/2021   Hyperlipidemia 08/16/2021   High risk medication use 08/16/2021   Bipolar 1 disorder (HCC) 10/06/2020   Intellectual disability 02/09/2017   Schizophrenia (HCC) 02/09/2017    No past surgical history on file.  No family history on file.  Social History   Socioeconomic History   Marital status: Single    Spouse name: Not on file   Number of children: Not on file   Years of education: Not on file   Highest education level: 10th grade  Occupational History   Not on file  Tobacco Use   Smoking status: Former    Types: Cigarettes    Quit date: 08/18/2017    Years since quitting: 5.2   Smokeless tobacco: Never   Tobacco comments:    intermittent cigarettes - smoked from friends  Vaping Use   Vaping Use: Never used  Substance and Sexual Activity   Alcohol use: No   Drug use: No   Sexual activity: Not on file  Other Topics Concern   Not on file  Social History Narrative   Not on file   Social Determinants of Health   Financial Resource Strain: Low Risk  (10/11/2021)   Overall Financial Resource Strain (CARDIA)    Difficulty of Paying Living Expenses: Not hard at all  Food Insecurity: No Food Insecurity (10/11/2021)   Hunger Vital Sign    Worried About Running Out of Food in the Last Year: Never true    Ran Out of Food in the Last Year: Never true  Transportation Needs: No Transportation Needs (10/19/2022)   PRAPARE - Administrator, Civil Service (Medical): No    Lack of Transportation (Non-Medical): No  Physical Activity: Inactive (10/19/2022)   Exercise Vital Sign    Days of Exercise per Week: 0 days    Minutes of Exercise per Session: 0 min  Stress: No Stress Concern Present (10/19/2022)   [employer] of Occupational Health - Occupational Stress Questionnaire    Feeling of Stress : Not at all  Social Connections: Socially Isolated (10/19/2022)   Social Connection and Isolation Panel  [NHANES]  Frequency of Communication with Friends and Family: Once a week    Frequency of Social Gatherings with Friends and Family: Twice a week    Attends Religious Services: Never    Database administrator or Organizations: No    Attends Banker Meetings: Never    Marital Status: Never married  Intimate Partner Violence: Not At Risk (10/19/2022)   Humiliation, Afraid, Rape, and Kick questionnaire    Fear of Current or Ex-Partner: No    Emotionally Abused: No    Physically Abused: No    Sexually Abused: No     Current Outpatient Medications:    acetaminophen (TYLENOL) 500 MG tablet, Take 500 mg by mouth every 6 (six) hours as needed., Disp: , Rfl:    benztropine (COGENTIN) 2 MG tablet, Take 2 mg by mouth at bedtime., Disp: , Rfl:    Cholecalciferol (VITAMIN D) 2000 units tablet, Take 2,000 Units by mouth daily., Disp: , Rfl:    haloperidol decanoate (HALDOL DECANOATE) 100 MG/ML injection, , Disp: , Rfl: 4   risperiDONE (RISPERDAL) 2 MG tablet, Take 2 mg by mouth daily., Disp: , Rfl:    risperidone (RISPERDAL) 4 MG tablet, Take 4 mg by mouth at bedtime., Disp: , Rfl:    sertraline (ZOLOFT) 25 MG tablet, , Disp: , Rfl:   No Known Allergies   ROS  Constitutional: Negative for fever or weight change.  Respiratory: Negative for cough and shortness of breath.   Cardiovascular: Negative for chest pain or palpitations.  Gastrointestinal: Negative for abdominal pain, no bowel changes.  Musculoskeletal: Negative for gait problem or joint swelling.  Skin: Negative for rash.  Neurological: Negative for dizziness or headache.  No other specific complaints in a complete review of systems (except as listed in HPI above).    Objective  Vitals:   11/20/22 1445  BP: 120/72  Pulse: 97  Resp: 18  Temp: 97.8 F (36.6 C)  TempSrc: Oral  SpO2: 99%  Weight: 234 lb 4.8 oz (106.3 kg)  Height: 5' 6.5" (1.689 m)    Body mass index is 37.25 kg/m.  Physical  Exam Constitutional: Patient appears well-developed and well-nourished. No distress.  HENT: Head: Normocephalic and atraumatic. Ears: B TMs ok, no erythema or effusion; Nose: Nose normal. Mouth/Throat: Oropharynx is clear and moist. No oropharyngeal exudate.  Eyes: Conjunctivae and EOM are normal. Pupils are equal, round, and reactive to light. No scleral icterus.  Neck: Normal range of motion. Neck supple. No JVD present. No thyromegaly present.  Cardiovascular: Normal rate, regular rhythm and normal heart sounds.  No murmur heard. No BLE edema. Pulmonary/Chest: Effort normal and breath sounds normal. No respiratory distress. Abdominal: Soft. Bowel sounds are normal, no distension. There is no tenderness. no masses MALE GENITALIA: Normal descended testes bilaterally, no masses palpated, no hernias, no lesions, no discharge RECTAL: Prostate normal size and consistency, no rectal masses or hemorrhoids Musculoskeletal: Normal range of motion, no joint effusions. No gross deformities Neurological: he is alert and oriented to person, place, and time. No cranial nerve deficit. Coordination, balance, strength, speech and gait are normal.  Skin: Skin is warm and dry. No rash noted. No erythema.  Psychiatric: Patient has a normal mood and affect. behavior is normal. Judgment and thought content normal.  Hearing Screening   500Hz  1000Hz  2000Hz  4000Hz   Right ear Pass Pass Pass Pass  Left ear Pass Pass Pass Pass     No results found for this or any previous visit (from the past  2160 hour(s)).   Fall Risk:    11/20/2022    2:44 PM 10/19/2022    1:16 PM 02/22/2022    1:01 PM 10/11/2021    3:41 PM 08/16/2021    1:25 PM  Fall Risk   Falls in the past year? 0 0 0 0 0  Number falls in past yr: 0 0 0 0 0  Injury with Fall? 0 0 0 0 0  Risk for fall due to :  No Fall Risks  No Fall Risks No Fall Risks  Follow up  Education provided;Falls prevention discussed Falls evaluation completed Falls prevention  discussed Falls prevention discussed      Functional Status Survey: Is the patient deaf or have difficulty hearing?: No Does the patient have difficulty seeing, even when wearing glasses/contacts?: No Does the patient have difficulty concentrating, remembering, or making decisions?: No Does the patient have difficulty walking or climbing stairs?: No Does the patient have difficulty dressing or bathing?: No Does the patient have difficulty doing errands alone such as visiting a doctor's office or shopping?: Yes    Assessment & Plan  1. Hyperlipidemia, unspecified hyperlipidemia type  - Lipid panel  2. High risk medication use  - CBC with Differential/Platelet - COMPLETE METABOLIC PANEL WITH GFR - Lipid panel - Hemoglobin A1c  3. Bipolar 1 disorder (HCC) Under the care of psychiatry  4. Disorganized schizophrenia (HCC) Under the care of psychiatry  5. Class 2 severe obesity due to excess calories with serious comorbidity and body mass index (BMI) of 37.0 to 37.9 in adult [facility]) Increase physical activity Eat well balanced diet  6. Screening for HIV without presence of risk factors  - HIV Antibody (routine testing w rflx)  7. Encounter for hepatitis C screening test for low risk patient  - Hepatitis C antibody  8. Screening for diabetes mellitus  - COMPLETE METABOLIC PANEL WITH GFR - Hemoglobin A1c  9. Screening for deficiency anemia  - CBC with Differential/Platelet  10. Encounter for completion of form with patient Fl2 form completed  11. Well adult exam FL2 form completed, with exam    -Prostate cancer screening and PSA options (with potential risks and benefits of testing vs not testing) were discussed along with recent recs/guidelines. -USPSTF grade A and B recommendations reviewed with patient; age-appropriate recommendations, preventive care, screening tests, etc discussed and encouraged; healthy living encouraged; see AVS for patient education given  to patient -Discussed importance of 150 minutes of physical activity weekly, eat two servings of fish weekly, eat one serving of tree nuts ( cashews, pistachios, pecans, almonds.Marland Kitchen) every other day, eat 6 servings of fruit/vegetables daily and drink plenty of water and avoid sweet beverages.  -Reviewed Health Maintenance: yes

## 2022-11-21 LAB — COMPLETE METABOLIC PANEL WITH GFR
AG Ratio: 2 (calc) (ref 1.0–2.5)
ALT: 17 U/L (ref 9–46)
AST: 13 U/L (ref 10–40)
Albumin: 4.4 g/dL (ref 3.6–5.1)
Alkaline phosphatase (APISO): 63 U/L (ref 36–130)
BUN: 12 mg/dL (ref 7–25)
CO2: 27 mmol/L (ref 20–32)
Calcium: 9.8 mg/dL (ref 8.6–10.3)
Chloride: 104 mmol/L (ref 98–110)
Creat: 1.13 mg/dL (ref 0.60–1.26)
Globulin: 2.2 g/dL (calc) (ref 1.9–3.7)
Glucose, Bld: 88 mg/dL (ref 65–99)
Potassium: 4 mmol/L (ref 3.5–5.3)
Sodium: 139 mmol/L (ref 135–146)
Total Bilirubin: 0.4 mg/dL (ref 0.2–1.2)
Total Protein: 6.6 g/dL (ref 6.1–8.1)
eGFR: 85 mL/min/{1.73_m2} (ref >=60)

## 2022-11-21 LAB — HEMOGLOBIN A1C
Hgb A1c MFr Bld: 5.2 % of total Hgb (ref <5.7)
Mean Plasma Glucose: 103 mg/dL
eAG (mmol/L): 5.7 mmol/L

## 2022-11-21 LAB — CBC WITH DIFFERENTIAL/PLATELET
Absolute Monocytes: 451 cells/uL (ref 200–950)
Basophils Absolute: 22 cells/uL (ref 0–200)
Basophils Relative: 0.4 %
Eosinophils Absolute: 50 cells/uL (ref 15–500)
Eosinophils Relative: 0.9 %
HCT: 42.1 % (ref 38.5–50.0)
Hemoglobin: 13.7 g/dL (ref 13.2–17.1)
Lymphs Abs: 2723 cells/uL (ref 850–3900)
MCH: 28.6 pg (ref 27.0–33.0)
MCHC: 32.5 g/dL (ref 32.0–36.0)
MCV: 87.9 fL (ref 80.0–100.0)
MPV: 12.1 fL (ref 7.5–12.5)
Monocytes Relative: 8.2 %
Neutro Abs: 2255 cells/uL (ref 1500–7800)
Neutrophils Relative %: 41 %
Platelets: 228 10*3/uL (ref 140–400)
RBC: 4.79 10*6/uL (ref 4.20–5.80)
RDW: 11.2 % (ref 11.0–15.0)
Total Lymphocyte: 49.5 %
WBC: 5.5 10*3/uL (ref 3.8–10.8)

## 2022-11-21 LAB — HEPATITIS C ANTIBODY: Hepatitis C Ab: NONREACTIVE

## 2022-11-21 LAB — LIPID PANEL
Cholesterol: 185 mg/dL (ref <200)
HDL: 37 mg/dL — ABNORMAL LOW (ref >=40)
LDL Cholesterol (Calc): 112 mg/dL (calc) — ABNORMAL HIGH
Non-HDL Cholesterol (Calc): 148 mg/dL (calc) — ABNORMAL HIGH (ref <130)
Total CHOL/HDL Ratio: 5 (calc) — ABNORMAL HIGH (ref <5.0)
Triglycerides: 253 mg/dL — ABNORMAL HIGH (ref <150)

## 2022-11-21 LAB — HIV ANTIBODY (ROUTINE TESTING W REFLEX): HIV 1&2 Ab, 4th Generation: NONREACTIVE

## 2023-10-22 ENCOUNTER — Other Ambulatory Visit: Payer: Self-pay | Admitting: Nurse Practitioner

## 2023-10-24 NOTE — Telephone Encounter (Signed)
 Requested medication (s) are due for refill today: yes  Requested medication (s) are on the active medication list: yes  Last refill 10/18/17  Future visit scheduled: yes  Notes to clinic:   Manual Review: Route requests for 50,000 IU strength to the provider, lasted refilled by another provider     Requested Prescriptions  Pending Prescriptions Disp Refills   Cholecalciferol (VITAMIN D3) 50 MCG (2000 UT) TABS [Pharmacy Med Name: Vitamin D3 50 MCG (2000 UT) Tablet] 25 tablet 10    Sig: TAKE 1 TABLET BY MOUTH ONCE DAILY     Endocrinology:  Vitamins - Vitamin D Supplementation 2 Failed - 10/24/2023 11:21 AM      Failed - Manual Review: Route requests for 50,000 IU strength to the provider      Failed - Vitamin D in normal range and within 360 days    No results found for: "ON6295MW4", "XL2440NU2", "[id number]", "25OHVITD3", "25OHVITD2", "25OHVITD1", "VD25OH"       Failed - Valid encounter within last 12 months    Recent Outpatient Visits   None            Passed - Ca in normal range and within 360 days    Calcium  Date Value Ref Range Status  11/20/2022 9.8 8.6 - 10.3 mg/dL Final

## 2023-10-25 ENCOUNTER — Ambulatory Visit: Payer: Self-pay

## 2023-10-25 VITALS — BP 120/80 | Wt 242.8 lb

## 2023-10-25 DIAGNOSIS — Z0000 Encounter for general adult medical examination without abnormal findings: Secondary | ICD-10-CM | POA: Diagnosis not present

## 2023-10-25 NOTE — Patient Instructions (Addendum)
 Thomas Forbes , Thank you for taking time out of your busy schedule to complete your Annual Wellness Visit with me. I enjoyed our conversation and look forward to speaking with you again next year. I, as well as your care team,  appreciate your ongoing commitment to your health goals. Please review the following plan we discussed and let me know if I can assist you in the future.  Follow up Visits: Next Medicare AWV with our clinical staff: 11/13/24 @ 8:10 AM IN PERSON   Have you seen your provider in the last 6 months (3 months if uncontrolled diabetes)? Yes   Clinician Recommendations:  Aim for 30 minutes of exercise or brisk walking, 6-8 glasses of water, and 5 servings of fruits and vegetables each day. TAKE CARE!      This is a list of the screening recommended for you and due dates:  Health Maintenance  Topic Date Due   COVID-19 Vaccine (3 - Moderna risk series) 09/12/2019   Medicare Annual Wellness Visit  10/19/2023   Flu Shot  01/11/2024   DTaP/Tdap/Td vaccine (2 - Td or Tdap) 05/21/2028   Hepatitis C Screening  Completed   HIV Screening  Completed   HPV Vaccine  Aged Out   Meningitis B Vaccine  Aged Out    Advanced directives: (ACP Link)Information on Advanced Care Planning can be found at [place]  Best boy Advance Health Care Directives Advance Health Care Directives. [email or web address]  Advance Care Planning is important because it:  [x]  Makes sure you receive the medical care that is consistent with your values, goals, and preferences  [x]  It provides guidance to your family and loved ones and reduces their decisional burden about whether or not they are making the right decisions based on your wishes.  Follow the link provided in your after visit summary or read over the paperwork we have mailed to you to help you started getting your Advance Directives in place. If you need assistance in completing these, please reach out to us  so that we can help you!

## 2023-10-25 NOTE — Progress Notes (Signed)
 Subjective:   Thomas Forbes is a 38 y.o. who presents for a Medicare Wellness preventive visit.  As a reminder, Annual Wellness Visits don't include a physical exam, and some assessments may be limited, especially if this visit is performed virtually. We may recommend an in-person visit if needed.  Visit Complete: In person   Persons Participating in Visit: Patient.  AWV Questionnaire: No: Patient Medicare AWV questionnaire was not completed prior to this visit.  Cardiac Risk Factors include: advanced age (>72men, >35 women);dyslipidemia;male gender;sedentary lifestyle;obesity (BMI >30kg/m2)     Objective:     Today's Vitals   10/25/23 1333  BP: 120/80  Weight: 242 lb 12.8 oz (110.1 kg)   Body mass index is 38.6 kg/m.     10/25/2023    1:41 PM 10/19/2022    1:47 PM 06/27/2021    1:32 PM 05/10/2017    8:44 PM 03/26/2017    3:38 PM 02/18/2017    7:37 PM  Advanced Directives  Does Patient Have a Medical Advance Directive? No No Unable to assess, patient is non-responsive or altered mental status No No No  Would patient like information on creating a medical advance directive? No - Patient declined     No - Patient declined    Current Medications (verified) Outpatient Encounter Medications as of 10/25/2023  Medication Sig   acetaminophen  (TYLENOL ) 500 MG tablet Take 500 mg by mouth every 6 (six) hours as needed.   benztropine  (COGENTIN ) 2 MG tablet Take 2 mg by mouth at bedtime.   Cholecalciferol (VITAMIN D3) 50 MCG (2000 UT) TABS TAKE 1 TABLET BY MOUTH ONCE DAILY   haloperidol decanoate (HALDOL DECANOATE) 100 MG/ML injection    risperiDONE  (RISPERDAL ) 2 MG tablet Take 2 mg by mouth daily.   risperidone  (RISPERDAL ) 4 MG tablet Take 4 mg by mouth at bedtime.   sertraline (ZOLOFT) 25 MG tablet    No facility-administered encounter medications on file as of 10/25/2023.    Allergies (verified) Patient has no known allergies.   History: Past Medical History:  Diagnosis  Date   Bipolar 1 disorder (HCC)    Chronic disorganized schizophrenia (HCC)    Schizophrenia (HCC)    No past surgical history on file. No family history on file. Social History   Socioeconomic History   Marital status: Single    Spouse name: Not on file   Number of children: Not on file   Years of education: Not on file   Highest education level: 10th grade  Occupational History   Not on file  Tobacco Use   Smoking status: Former    Current packs/day: 0.00    Types: Cigarettes    Quit date: 08/18/2017    Years since quitting: 6.1   Smokeless tobacco: Never   Tobacco comments:    intermittent cigarettes - smoked from friends  Vaping Use   Vaping status: Never Used  Substance and Sexual Activity   Alcohol use: No   Drug use: No   Sexual activity: Not on file  Other Topics Concern   Not on file  Social History Narrative   Not on file   Social Drivers of Health   Financial Resource Strain: Low Risk  (10/25/2023)   Overall Financial Resource Strain (CARDIA)    Difficulty of Paying Living Expenses: Not hard at all  Food Insecurity: No Food Insecurity (10/25/2023)   Hunger Vital Sign    Worried About Running Out of Food in the Last Year: Never true  Ran Out of Food in the Last Year: Never true  Transportation Needs: No Transportation Needs (10/25/2023)   PRAPARE - Administrator, Civil Service (Medical): No    Lack of Transportation (Non-Medical): No  Physical Activity: Inactive (10/25/2023)   Exercise Vital Sign    Days of Exercise per Week: 0 days    Minutes of Exercise per Session: 0 min  Stress: No Stress Concern Present (10/25/2023)   [employer] of Occupational Health - Occupational Stress Questionnaire    Feeling of Stress : Not at all  Social Connections: Socially Isolated (10/25/2023)   Social Connection and Isolation Panel [NHANES]    Frequency of Communication with Friends and Family: Once a week    Frequency of Social Gatherings with  Friends and Family: Twice a week    Attends Religious Services: Never    Database administrator or Organizations: No    Attends Engineer, structural: Never    Marital Status: Never married    Tobacco Counseling Counseling given: Not Answered Tobacco comments: intermittent cigarettes - smoked from friends    Clinical Intake:  Pre-visit preparation completed: Yes  Pain : No/denies pain     BMI - recorded: 38.6 Nutritional Status: BMI > 30  Obese Nutritional Risks: None Diabetes: No  Lab Results  Component Value Date   HGBA1C 5.2 11/20/2022   HGBA1C 5.1 10/06/2020   HGBA1C 4.9 09/16/2019     How often do you need to have someone help you when you read instructions, pamphlets, or other written materials from your doctor or pharmacy?: 1 - Never  Interpreter Needed?: No  Information entered by :: Dellie Fergusson, LPN   Activities of Daily Living    10/25/2023    1:41 PM 11/20/2022    2:45 PM  In your present state of health, do you have any difficulty performing the following activities:  Hearing? 0 0  Vision? 0 0  Difficulty concentrating or making decisions? 1 0  Walking or climbing stairs? 0 0  Dressing or bathing? 0 0  Doing errands, shopping? 1 1  Preparing Food and eating ? N   Using the Toilet? N   In the past six months, have you accidently leaked urine? N   Do you have problems with loss of bowel control? N   Managing your Medications? Y   Managing your Finances? Y   Housekeeping or managing your Housekeeping? Y     Patient Care Team: Quinton Buckler, FNP as PCP - General (Nurse Practitioner)  Indicate any recent Medical Services you may have received from other than [facility] providers in the past year (date may be approximate).     Assessment:    This is a routine wellness examination for Thomas Forbes.  Hearing/Vision screen Hearing Screening - Comments:: NO AIDS Vision Screening - Comments:: NO GLASSES   Goals Addressed              This Visit's Progress    DIET - EAT MORE FRUITS AND VEGETABLES         Depression Screen     10/25/2023    1:38 PM 11/20/2022    2:45 PM 10/19/2022    1:17 PM 02/22/2022    1:02 PM 10/11/2021    3:39 PM 08/16/2021    1:25 PM 10/06/2020    9:40 AM  PHQ 2/9 Scores  PHQ - 2 Score 1 0 0 0 0 0 0  PHQ- 9 Score 1  0 0    Fall Risk     10/25/2023    1:41 PM 11/20/2022    2:44 PM 10/19/2022    1:16 PM 02/22/2022    1:01 PM 10/11/2021    3:41 PM  Fall Risk   Falls in the past year? 0 0 0 0 0  Number falls in past yr: 0 0 0 0 0  Injury with Fall? 0 0 0 0 0  Risk for fall due to : No Fall Risks  No Fall Risks  No Fall Risks  Follow up Falls prevention discussed;Falls evaluation completed  Education provided;Falls prevention discussed Falls evaluation completed Falls prevention discussed    MEDICARE RISK AT HOME:  Medicare Risk at Home Any stairs in or around the home?: Yes If so, are there any without handrails?: No Home free of loose throw rugs in walkways, pet beds, electrical cords, etc?: Yes Adequate lighting in your home to reduce risk of falls?: Yes Life alert?: No Use of a cane, walker or w/c?: No Grab bars in the bathroom?: No Shower chair or bench in shower?: No Elevated toilet seat or a handicapped toilet?: No  TIMED UP AND GO:  Was the test performed?  Yes  Length of time to ambulate 10 feet: 4 sec Gait steady and fast without use of assistive device  Cognitive Function: 6CIT completed        10/25/2023    1:42 PM 10/19/2022    1:21 PM  6CIT Screen  What Year? 0 points 0 points  What month? 0 points 0 points  What time? 0 points 0 points  Count back from 20 0 points 0 points  Months in reverse 4 points 4 points  Repeat phrase 0 points 6 points  Total Score 4 points 10 points    Immunizations Immunization History  Administered Date(s) Administered   Influenza,inj,quad, With Preservative 04/17/2017   Moderna Sars-Covid-2 Vaccination 07/18/2019, 08/15/2019   Tdap  05/21/2018    Screening Tests Health Maintenance  Topic Date Due   COVID-19 Vaccine (3 - Moderna risk series) 09/12/2019   Medicare Annual Wellness (AWV)  10/19/2023   INFLUENZA VACCINE  01/11/2024   DTaP/Tdap/Td (2 - Td or Tdap) 05/21/2028   Hepatitis C Screening  Completed   HIV Screening  Completed   HPV VACCINES  Aged Out   Meningococcal B Vaccine  Aged Out    Health Maintenance  Health Maintenance Due  Topic Date Due   COVID-19 Vaccine (3 - Moderna risk series) 09/12/2019   Medicare Annual Wellness (AWV)  10/19/2023   Health Maintenance Items Addressed: UP TO DATE W/ TDAP, NO MORE COVID SHOTS  Additional Screening:  Vision Screening: Recommended annual ophthalmology exams for early detection of glaucoma and other disorders of the eye.  Dental Screening: Recommended annual dental exams for proper oral hygiene  Community Resource Referral / Chronic Care Management: CRR required this visit?  No   CCM required this visit?  No   Plan:    I have personally reviewed and noted the following in the patient's chart:   Medical and social history Use of alcohol, tobacco or illicit drugs  Current medications and supplements including opioid prescriptions. Patient is not currently taking opioid prescriptions. Functional ability and status Nutritional status Physical activity Advanced directives List of other physicians Hospitalizations, surgeries, and ER visits in previous 12 months Vitals Screenings to include cognitive, depression, and falls Referrals and appointments  In addition, I have reviewed and discussed with patient certain preventive  protocols, quality metrics, and best practice recommendations. A written personalized care plan for preventive services as well as general preventive health recommendations were provided to patient.   Pinky Bright, LPN   [id number]   After Visit Summary: (In Person-Declined) Patient declined AVS at this time.  Notes:  Nothing significant to report at this time. GROUP HOME INFO SHEET SIGNED BY JULIE PENDER

## 2023-12-05 ENCOUNTER — Encounter: Admitting: Nurse Practitioner

## 2023-12-10 ENCOUNTER — Encounter: Admitting: Nurse Practitioner

## 2023-12-10 NOTE — Progress Notes (Deleted)
 Name: Thomas Forbes   MRN: [id number]    DOB: [birth year]   Date:12/10/2023       Progress Note  Subjective  Chief Complaint  No chief complaint on file.   HPI  Patient presents for annual FL2 exam.   Discussed the use of AI scribe software for clinical note transcription with the patient, who gave verbal consent to proceed.  History of Present Illness     Diet: *** Exercise: *** Sleep: *** Last dental exam:*** Last eye exam: ***  Depression: phq 9 is {gen pos [id number]}    10/25/2023    1:38 PM 11/20/2022    2:45 PM 10/19/2022    1:17 PM 02/22/2022    1:02 PM 10/11/2021    3:39 PM  Depression screen PHQ 2/9  Decreased Interest 0 0 0 0 0  Down, Depressed, Hopeless 1 0 0 0 0  PHQ - 2 Score 1 0 0 0 0  Altered sleeping 0      Tired, decreased energy 0      Change in appetite 0      Feeling bad or failure about yourself  0      Trouble concentrating 0      Moving slowly or fidgety/restless 0      Suicidal thoughts 0      PHQ-9 Score 1      Difficult doing work/chores Not difficult at all        Hypertension:  BP Readings from Last 3 Encounters:  10/25/23 120/80  11/20/22 120/72  10/19/22 124/72    Obesity: Wt Readings from Last 3 Encounters:  10/25/23 242 lb 12.8 oz (110.1 kg)  11/20/22 234 lb 4.8 oz (106.3 kg)  10/19/22 235 lb 6.4 oz (106.8 kg)   BMI Readings from Last 3 Encounters:  10/25/23 38.60 kg/m  11/20/22 37.25 kg/m  10/19/22 37.43 kg/m     Lipids:  Lab Results  Component Value Date   CHOL 185 11/20/2022   CHOL 173 02/22/2022   CHOL 168 08/16/2021   Lab Results  Component Value Date   HDL 37 (L) 11/20/2022   HDL 39 (L) 02/22/2022   HDL 35 (L) 08/16/2021   Lab Results  Component Value Date   LDLCALC 112 (H) 11/20/2022   LDLCALC 107 (H) 02/22/2022   LDLCALC 105 (H) 08/16/2021   Lab Results  Component Value Date   TRIG 253 (H) 11/20/2022   TRIG 154 (H) 02/22/2022   TRIG 167 (H) 08/16/2021   Lab Results  Component Value Date    CHOLHDL 5.0 (H) 11/20/2022   CHOLHDL 4.4 02/22/2022   CHOLHDL 4.8 08/16/2021   No results found for: LDLDIRECT Glucose:  Glucose, Bld  Date Value Ref Range Status  11/20/2022 88 65 - 99 mg/dL Final    Comment:    .            Fasting reference interval .   02/22/2022 97 65 - 99 mg/dL Final    Comment:    .            Fasting reference interval .   08/16/2021 81 65 - 99 mg/dL Final    Comment:    .            Fasting reference interval .    Glucose-Capillary  Date Value Ref Range Status  03/26/2017 116 (H) 65 - 99 mg/dL Final    Flowsheet Row Clinical Support from 10/25/2023 in [facility]  AUDIT-C Score 0  Single STD testing and prevention (HIV/chl/gon/syphilis): completed Hep C: completed  Skin cancer: Discussed monitoring for atypical lesions Colorectal cancer: does not qualify Prostate cancer: does not qualify No results found for: PSA   Lung cancer:   Low Dose CT Chest recommended if Age 45-80 years, 30 pack-year currently smoking OR have quit w/in 15years. Patient does not qualify.   AAA:  The USPSTF recommends one-time screening with ultrasonography in men ages 45 to 69 years who have ever smoked ECG:  03/27/2017  Vaccines:  HPV: up to at age 74 , ask insurance if age between 83-45  Shingrix: 48-64 yo and ask insurance if covered when patient above 78 yo Pneumonia:  educated and discussed with patient. Flu:  educated and discussed with patient.  Advanced Care Planning: A voluntary discussion about advance care planning including the explanation and discussion of advance directives.  Discussed health care proxy and Living will, and the patient was able to identify a health care proxy as is not sure.  Patient does not have a living will at present time. If patient does have living will, I have requested they bring this to the clinic to be scanned in to their chart.  Patient Active Problem List   Diagnosis Date Noted    Class 2 severe obesity due to excess calories with serious comorbidity and body mass index (BMI) of 37.0 to 37.9 in adult [facility]) 08/16/2021   Hyperlipidemia 08/16/2021   High risk medication use 08/16/2021   Bipolar 1 disorder (HCC) 10/06/2020   Intellectual disability 02/09/2017   Schizophrenia (HCC) 02/09/2017    No past surgical history on file.  No family history on file.  Social History   Socioeconomic History   Marital status: Single    Spouse name: Not on file   Number of children: Not on file   Years of education: Not on file   Highest education level: 10th grade  Occupational History   Not on file  Tobacco Use   Smoking status: Former    Current packs/day: 0.00    Types: Cigarettes    Quit date: 08/18/2017    Years since quitting: 6.3   Smokeless tobacco: Never   Tobacco comments:    intermittent cigarettes - smoked from friends  Vaping Use   Vaping status: Never Used  Substance and Sexual Activity   Alcohol use: No   Drug use: No   Sexual activity: Not on file  Other Topics Concern   Not on file  Social History Narrative   Not on file   Social Drivers of Health   Financial Resource Strain: Low Risk  (10/25/2023)   Overall Financial Resource Strain (CARDIA)    Difficulty of Paying Living Expenses: Not hard at all  Food Insecurity: No Food Insecurity (10/25/2023)   Hunger Vital Sign    Worried About Running Out of Food in the Last Year: Never true    Ran Out of Food in the Last Year: Never true  Transportation Needs: No Transportation Needs (10/25/2023)   PRAPARE - Administrator, Civil Service (Medical): No    Lack of Transportation (Non-Medical): No  Physical Activity: Inactive (10/25/2023)   Exercise Vital Sign    Days of Exercise per Week: 0 days    Minutes of Exercise per Session: 0 min  Stress: No Stress Concern Present (10/25/2023)   [employer] of Occupational Health - Occupational Stress Questionnaire    Feeling of Stress : Not  at all  Social Connections: Socially  Isolated (10/25/2023)   Social Connection and Isolation Panel    Frequency of Communication with Friends and Family: Once a week    Frequency of Social Gatherings with Friends and Family: Twice a week    Attends Religious Services: Never    Database administrator or Organizations: No    Attends Banker Meetings: Never    Marital Status: Never married  Intimate Partner Violence: Not At Risk (10/25/2023)   Humiliation, Afraid, Rape, and Kick questionnaire    Fear of Current or Ex-Partner: No    Emotionally Abused: No    Physically Abused: No    Sexually Abused: No     Current Outpatient Medications:    acetaminophen  (TYLENOL ) 500 MG tablet, Take 500 mg by mouth every 6 (six) hours as needed., Disp: , Rfl:    benztropine  (COGENTIN ) 2 MG tablet, Take 2 mg by mouth at bedtime., Disp: , Rfl:    Cholecalciferol (VITAMIN D3) 50 MCG (2000 UT) TABS, TAKE 1 TABLET BY MOUTH ONCE DAILY, Disp: 90 tablet, Rfl: 3   haloperidol decanoate (HALDOL DECANOATE) 100 MG/ML injection, , Disp: , Rfl: 4   risperiDONE  (RISPERDAL ) 2 MG tablet, Take 2 mg by mouth daily., Disp: , Rfl:    risperidone  (RISPERDAL ) 4 MG tablet, Take 4 mg by mouth at bedtime., Disp: , Rfl:    sertraline (ZOLOFT) 25 MG tablet, , Disp: , Rfl:   No Known Allergies   ROS  Constitutional: Negative for fever or weight change.  Respiratory: Negative for cough and shortness of breath.   Cardiovascular: Negative for chest pain or palpitations.  Gastrointestinal: Negative for abdominal pain, no bowel changes.  Musculoskeletal: Negative for gait problem or joint swelling.  Skin: Negative for rash.  Neurological: Negative for dizziness or headache.  No other specific complaints in a complete review of systems (except as listed in HPI above).    Objective  There were no vitals filed for this visit.  There is no height or weight on file to calculate BMI.  Physical Exam  Physical  Exam    No results found for this or any previous visit (from the past 2160 hours).   Fall Risk:    10/25/2023    1:41 PM 11/20/2022    2:44 PM 10/19/2022    1:16 PM 02/22/2022    1:01 PM 10/11/2021    3:41 PM  Fall Risk   Falls in the past year? 0 0 0 0 0  Number falls in past yr: 0 0 0 0 0  Injury with Fall? 0 0 0 0 0  Risk for fall due to : No Fall Risks  No Fall Risks  No Fall Risks  Follow up Falls prevention discussed;Falls evaluation completed  Education provided;Falls prevention discussed Falls evaluation completed  Falls prevention discussed      Data saved with a previous flowsheet row definition      Functional Status Survey:   ***   Assessment & Plan  Assessment and Plan Assessment & Plan      -Prostate cancer screening and PSA options (with potential risks and benefits of testing vs not testing) were discussed along with recent recs/guidelines. -USPSTF grade A and B recommendations reviewed with patient; age-appropriate recommendations, preventive care, screening tests, etc discussed and encouraged; healthy living encouraged; see AVS for patient education given to patient -Discussed importance of 150 minutes of physical activity weekly, eat two servings of fish weekly, eat one serving of tree nuts ( cashews, pistachios, pecans,  almonds.SABRA) every other day, eat 6 servings of fruit/vegetables daily and drink plenty of water and avoid sweet beverages.  -Reviewed Health Maintenance: yes

## 2023-12-19 ENCOUNTER — Encounter: Payer: Self-pay | Admitting: Nurse Practitioner

## 2023-12-19 ENCOUNTER — Telehealth: Payer: Self-pay | Admitting: Nurse Practitioner

## 2023-12-19 ENCOUNTER — Ambulatory Visit: Admitting: Nurse Practitioner

## 2023-12-19 VITALS — BP 116/78 | HR 103 | Temp 97.6°F | Resp 18 | Ht 66.5 in | Wt 238.8 lb

## 2023-12-19 DIAGNOSIS — Z131 Encounter for screening for diabetes mellitus: Secondary | ICD-10-CM

## 2023-12-19 DIAGNOSIS — F201 Disorganized schizophrenia: Secondary | ICD-10-CM

## 2023-12-19 DIAGNOSIS — Z79899 Other long term (current) drug therapy: Secondary | ICD-10-CM

## 2023-12-19 DIAGNOSIS — E785 Hyperlipidemia, unspecified: Secondary | ICD-10-CM

## 2023-12-19 DIAGNOSIS — E66812 Obesity, class 2: Secondary | ICD-10-CM

## 2023-12-19 DIAGNOSIS — Z130 Encounter for screening for diseases of the blood and blood-forming organs and certain disorders involving the immune mechanism: Secondary | ICD-10-CM

## 2023-12-19 DIAGNOSIS — F319 Bipolar disorder, unspecified: Secondary | ICD-10-CM | POA: Diagnosis not present

## 2023-12-19 DIAGNOSIS — F79 Unspecified intellectual disabilities: Secondary | ICD-10-CM | POA: Diagnosis not present

## 2023-12-19 DIAGNOSIS — Z0289 Encounter for other administrative examinations: Secondary | ICD-10-CM

## 2023-12-19 DIAGNOSIS — Z0000 Encounter for general adult medical examination without abnormal findings: Secondary | ICD-10-CM

## 2023-12-19 DIAGNOSIS — Z6837 Body mass index (BMI) 37.0-37.9, adult: Secondary | ICD-10-CM

## 2023-12-19 LAB — CBC WITH DIFFERENTIAL/PLATELET
Absolute Lymphocytes: 2675 {cells}/uL (ref 850–3900)
Absolute Monocytes: 343 {cells}/uL (ref 200–950)
Basophils Absolute: 29 {cells}/uL (ref 0–200)
Basophils Relative: 0.6 %
Eosinophils Absolute: 39 {cells}/uL (ref 15–500)
Eosinophils Relative: 0.8 %
HCT: 42.8 % (ref 38.5–50.0)
Hemoglobin: 13 g/dL — ABNORMAL LOW (ref 13.2–17.1)
MCH: 28.1 pg (ref 27.0–33.0)
MCHC: 30.4 g/dL — ABNORMAL LOW (ref 32.0–36.0)
MCV: 92.6 fL (ref 80.0–100.0)
MPV: 11.8 fL (ref 7.5–12.5)
Monocytes Relative: 7 %
Neutro Abs: 1813 {cells}/uL (ref 1500–7800)
Neutrophils Relative %: 37 %
Platelets: 219 Thousand/uL (ref 140–400)
RBC: 4.62 Million/uL (ref 4.20–5.80)
RDW: 11.1 % (ref 11.0–15.0)
Total Lymphocyte: 54.6 %
WBC: 4.9 Thousand/uL (ref 3.8–10.8)

## 2023-12-19 LAB — COMPREHENSIVE METABOLIC PANEL WITH GFR
AG Ratio: 2 (calc) (ref 1.0–2.5)
ALT: 16 U/L (ref 9–46)
AST: 12 U/L (ref 10–40)
Albumin: 4.3 g/dL (ref 3.6–5.1)
Alkaline phosphatase (APISO): 68 U/L (ref 36–130)
BUN: 13 mg/dL (ref 7–25)
CO2: 27 mmol/L (ref 20–32)
Calcium: 9.7 mg/dL (ref 8.6–10.3)
Chloride: 107 mmol/L (ref 98–110)
Creat: 1.23 mg/dL (ref 0.60–1.26)
Globulin: 2.2 g/dL (ref 1.9–3.7)
Glucose, Bld: 102 mg/dL — ABNORMAL HIGH (ref 65–99)
Potassium: 4.2 mmol/L (ref 3.5–5.3)
Sodium: 141 mmol/L (ref 135–146)
Total Bilirubin: 0.4 mg/dL (ref 0.2–1.2)
Total Protein: 6.5 g/dL (ref 6.1–8.1)
eGFR: 77 mL/min/1.73m2 (ref >=60)

## 2023-12-19 LAB — LIPID PANEL
Cholesterol: 181 mg/dL (ref <200)
HDL: 42 mg/dL (ref >=40)
LDL Cholesterol (Calc): 118 mg/dL — ABNORMAL HIGH
Non-HDL Cholesterol (Calc): 139 mg/dL — ABNORMAL HIGH (ref <130)
Total CHOL/HDL Ratio: 4.3 (calc) (ref <5.0)
Triglycerides: 104 mg/dL (ref <150)

## 2023-12-19 LAB — HEMOGLOBIN A1C
Hgb A1c MFr Bld: 5.1 % (ref <5.7)
Mean Plasma Glucose: 100 mg/dL
eAG (mmol/L): 5.5 mmol/L

## 2023-12-19 NOTE — Progress Notes (Signed)
 Name: Armondo Cech   MRN: [id number]    DOB: 07/09/1985   Date:12/19/2023       Progress Note  Subjective  Chief Complaint  Chief Complaint  Patient presents with   Annual Exam    FL2    HPI  Patient presents for annual CPE and FL2 form.  Discussed the use of AI scribe software for clinical note transcription with the patient, who gave verbal consent to proceed.  History of Present Illness Forest Staffa is a 38 year old male who presents for an annual physical exam.  He has not been engaging in regular physical exercise recently.  His sleep is inadequate, as he goes to bed around eight o'clock after midnight and wakes up in the morning, though he does not specify the exact number of hours he sleeps.  His last dental visit was sometime last year before his shift, and he has seen an eye doctor once.  No ear problems, abdominal pain, or issues with urination.      Diet: tries to eat well balanced Exercise: none, recommend 150 min of physical activity weekly   Sleep: 8 hours Last dental exam:last year Last eye exam: not recently  Depression: phq 9 is negative    10/25/2023    1:38 PM 11/20/2022    2:45 PM 10/19/2022    1:17 PM 02/22/2022    1:02 PM 10/11/2021    3:39 PM  Depression screen PHQ 2/9  Decreased Interest 0 0 0 0 0  Down, Depressed, Hopeless 1 0 0 0 0  PHQ - 2 Score 1 0 0 0 0  Altered sleeping 0      Tired, decreased energy 0      Change in appetite 0      Feeling bad or failure about yourself  0      Trouble concentrating 0      Moving slowly or fidgety/restless 0      Suicidal thoughts 0      PHQ-9 Score 1      Difficult doing work/chores Not difficult at all        Hypertension:  BP Readings from Last 3 Encounters:  12/19/23 116/78  10/25/23 120/80  11/20/22 120/72    Obesity: Wt Readings from Last 3 Encounters:  12/19/23 238 lb 12.8 oz (108.3 kg)  10/25/23 242 lb 12.8 oz (110.1 kg)  11/20/22 234 lb 4.8 oz (106.3 kg)   BMI Readings from  Last 3 Encounters:  12/19/23 37.97 kg/m  10/25/23 38.60 kg/m  11/20/22 37.25 kg/m     Lipids:  Lab Results  Component Value Date   CHOL 185 11/20/2022   CHOL 173 02/22/2022   CHOL 168 08/16/2021   Lab Results  Component Value Date   HDL 37 (L) 11/20/2022   HDL 39 (L) 02/22/2022   HDL 35 (L) 08/16/2021   Lab Results  Component Value Date   LDLCALC 112 (H) 11/20/2022   LDLCALC 107 (H) 02/22/2022   LDLCALC 105 (H) 08/16/2021   Lab Results  Component Value Date   TRIG 253 (H) 11/20/2022   TRIG 154 (H) 02/22/2022   TRIG 167 (H) 08/16/2021   Lab Results  Component Value Date   CHOLHDL 5.0 (H) 11/20/2022   CHOLHDL 4.4 02/22/2022   CHOLHDL 4.8 08/16/2021   No results found for: LDLDIRECT Glucose:  Glucose, Bld  Date Value Ref Range Status  11/20/2022 88 65 - 99 mg/dL Final    Comment:    .  Fasting reference interval .   02/22/2022 97 65 - 99 mg/dL Final    Comment:    .            Fasting reference interval .   08/16/2021 81 65 - 99 mg/dL Final    Comment:    .            Fasting reference interval .    Glucose-Capillary  Date Value Ref Range Status  03/26/2017 116 (H) 65 - 99 mg/dL Final    Flowsheet Row Clinical Support from 10/25/2023 in [facility]  AUDIT-C Score 0     Single STD testing and prevention (HIV/chl/gon/syphilis): completed Hep C: completed  Skin cancer: Discussed monitoring for atypical lesions Colorectal cancer: does not qualify Prostate cancer: does not qualify No results found for: PSA   Lung cancer:   Low Dose CT Chest recommended if Age 29-80 years, 30 pack-year currently smoking OR have quit w/in 15years. Patient does not qualify.   AAA:  The USPSTF recommends one-time screening with ultrasonography in men ages 12 to 62 years who have ever smoked ECG:  03/27/2017  Vaccines:  HPV: up to at age 29 , ask insurance if age between 37-45  Shingrix: 30-64 yo and ask insurance if  covered when patient above 62 yo Pneumonia:  educated and discussed with patient. Flu:  educated and discussed with patient.  Advanced Care Planning: A voluntary discussion about advance care planning including the explanation and discussion of advance directives.  Discussed health care proxy and Living will, and the patient was able to identify a health care proxy as court appointed gardian.  Patient does not have a living will at present time. If patient does have living will, I have requested they bring this to the clinic to be scanned in to their chart.  Patient Active Problem List   Diagnosis Date Noted   Class 2 severe obesity due to excess calories with serious comorbidity and body mass index (BMI) of 37.0 to 37.9 in adult [facility]) 08/16/2021   Hyperlipidemia 08/16/2021   High risk medication use 08/16/2021   Bipolar 1 disorder (HCC) 10/06/2020   Intellectual disability 02/09/2017   Schizophrenia (HCC) 02/09/2017    History reviewed. No pertinent surgical history.  History reviewed. No pertinent family history.  Social History   Socioeconomic History   Marital status: Single    Spouse name: Not on file   Number of children: Not on file   Years of education: Not on file   Highest education level: 10th grade  Occupational History   Not on file  Tobacco Use   Smoking status: Former    Current packs/day: 0.00    Types: Cigarettes    Quit date: 08/18/2017    Years since quitting: 6.3   Smokeless tobacco: Never   Tobacco comments:    intermittent cigarettes - smoked from friends  Vaping Use   Vaping status: Never Used  Substance and Sexual Activity   Alcohol use: No   Drug use: No   Sexual activity: Not on file  Other Topics Concern   Not on file  Social History Narrative   Not on file   Social Drivers of Health   Financial Resource Strain: Low Risk  (10/25/2023)   Overall Financial Resource Strain (CARDIA)    Difficulty of Paying Living Expenses: Not hard at all   Food Insecurity: No Food Insecurity (10/25/2023)   Hunger Vital Sign    Worried About Running Out  of Food in the Last Year: Never true    Ran Out of Food in the Last Year: Never true  Transportation Needs: No Transportation Needs (10/25/2023)   PRAPARE - Administrator, Civil Service (Medical): No    Lack of Transportation (Non-Medical): No  Physical Activity: Inactive (10/25/2023)   Exercise Vital Sign    Days of Exercise per Week: 0 days    Minutes of Exercise per Session: 0 min  Stress: No Stress Concern Present (10/25/2023)   [employer] of Occupational Health - Occupational Stress Questionnaire    Feeling of Stress : Not at all  Social Connections: Socially Isolated (10/25/2023)   Social Connection and Isolation Panel    Frequency of Communication with Friends and Family: Once a week    Frequency of Social Gatherings with Friends and Family: Twice a week    Attends Religious Services: Never    Database administrator or Organizations: No    Attends Banker Meetings: Never    Marital Status: Never married  Intimate Partner Violence: Not At Risk (10/25/2023)   Humiliation, Afraid, Rape, and Kick questionnaire    Fear of Current or Ex-Partner: No    Emotionally Abused: No    Physically Abused: No    Sexually Abused: No     Current Outpatient Medications:    acetaminophen  (TYLENOL ) 500 MG tablet, Take 500 mg by mouth every 6 (six) hours as needed., Disp: , Rfl:    benztropine  (COGENTIN ) 2 MG tablet, Take 2 mg by mouth at bedtime., Disp: , Rfl:    Cholecalciferol (VITAMIN D3) 50 MCG (2000 UT) TABS, TAKE 1 TABLET BY MOUTH ONCE DAILY, Disp: 90 tablet, Rfl: 3   haloperidol (HALDOL) 10 MG tablet, Take 10 mg by mouth 4 (four) times daily., Disp: , Rfl:    risperiDONE  (RISPERDAL ) 2 MG tablet, Take 2 mg by mouth daily., Disp: , Rfl:    risperidone  (RISPERDAL ) 4 MG tablet, Take 4 mg by mouth at bedtime., Disp: , Rfl:    sertraline (ZOLOFT) 25 MG tablet, , Disp:  , Rfl:    haloperidol decanoate (HALDOL DECANOATE) 100 MG/ML injection, , Disp: , Rfl: 4  No Known Allergies   ROS  Constitutional: Negative for fever or weight change.  Respiratory: Negative for cough and shortness of breath.   Cardiovascular: Negative for chest pain or palpitations.  Gastrointestinal: Negative for abdominal pain, no bowel changes.  Musculoskeletal: Negative for gait problem or joint swelling.  Skin: Negative for rash.  Neurological: Negative for dizziness or headache.  No other specific complaints in a complete review of systems (except as listed in HPI above).    Objective  Vitals:   12/19/23 0913  BP: 116/78  Pulse: (!) 103  Resp: 18  Temp: 97.6 F (36.4 C)  SpO2: 95%  Weight: 238 lb 12.8 oz (108.3 kg)  Height: 5' 6.5 (1.689 m)    Body mass index is 37.97 kg/m.  Physical Exam Vitals reviewed.  Constitutional:      Appearance: Normal appearance.  HENT:     Head: Normocephalic.     Right Ear: Tympanic membrane normal.     Left Ear: Tympanic membrane normal.     Nose: Nose normal.  Eyes:     Extraocular Movements: Extraocular movements intact.     Conjunctiva/sclera: Conjunctivae normal.     Pupils: Pupils are equal, round, and reactive to light.  Neck:     Thyroid : No thyroid  mass, thyromegaly or thyroid  tenderness.  Cardiovascular:     Rate and Rhythm: Normal rate and regular rhythm.     Pulses: Normal pulses.     Heart sounds: Normal heart sounds.  Pulmonary:     Effort: Pulmonary effort is normal.     Breath sounds: Normal breath sounds.  Abdominal:     General: Bowel sounds are normal.     Palpations: Abdomen is soft.  Musculoskeletal:        General: Normal range of motion.     Cervical back: Normal range of motion and neck supple.     Right lower leg: No edema.     Left lower leg: No edema.  Skin:    General: Skin is warm and dry.     Capillary Refill: Capillary refill takes less than 2 seconds.  Neurological:     General:  No focal deficit present.     Mental Status: He is alert and oriented to person, place, and time. Mental status is at baseline.  Psychiatric:        Mood and Affect: Mood normal.        Behavior: Behavior normal.        Thought Content: Thought content normal.        Judgment: Judgment normal.      No results found for this or any previous visit (from the past 2160 hours).   Fall Risk:    12/19/2023    9:14 AM 10/25/2023    1:41 PM 11/20/2022    2:44 PM 10/19/2022    1:16 PM 02/22/2022    1:01 PM  Fall Risk   Falls in the past year? 0 0 0 0 0  Number falls in past yr: 0 0 0 0 0  Injury with Fall? 0 0 0 0 0  Risk for fall due to :  No Fall Risks  No Fall Risks   Follow up Falls evaluation completed Falls prevention discussed;Falls evaluation completed  Education provided;Falls prevention discussed Falls evaluation completed      Data saved with a previous flowsheet row definition      Functional Status Survey: Is the patient deaf or have difficulty hearing?: No Does the patient have difficulty seeing, even when wearing glasses/contacts?: No Does the patient have difficulty concentrating, remembering, or making decisions?: No Does the patient have difficulty walking or climbing stairs?: No Does the patient have difficulty dressing or bathing?: No Does the patient have difficulty doing errands alone such as visiting a doctor's office or shopping?: No    Assessment & Plan  Problem List Items Addressed This Visit       Other   Intellectual disability   Schizophrenia (HCC)   Bipolar 1 disorder (HCC)   Class 2 severe obesity due to excess calories with serious comorbidity and body mass index (BMI) of 37.0 to 37.9 in adult [facility])   Hyperlipidemia   Relevant Orders   Lipid panel   High risk medication use   Relevant Orders   CBC with Differential/Platelet   Comprehensive metabolic panel with GFR   Lipid panel   Hemoglobin A1c   Other Visit Diagnoses       Annual  physical exam    -  Primary   Relevant Orders   CBC with Differential/Platelet   Comprehensive metabolic panel with GFR   Lipid panel   Hemoglobin A1c     Screening for diabetes mellitus       Relevant Orders   Comprehensive metabolic panel with GFR  Hemoglobin A1c     Screening for deficiency anemia       Relevant Orders   CBC with Differential/Platelet     Encounter for completion of form with patient          Assessment and Plan Assessment & Plan Routine Adult Wellness Examination No new complaints or issues. Blood pressure is within normal range. Physical examination, including heart, lungs, abdomen, and neurological assessment, is unremarkable. No issues with urination, bowel movements, or ears. Lymph nodes are normal. Guardian designated for medical decision-making if needed. - Perform blood work to assess internal health - Encourage 150 minutes of physical activity per week for cardiovascular health - Advise on maintaining a well-balanced diet - Mention regular dental and ophthalmologic check-ups - Confirm guardian for medical decision-making     -Prostate cancer screening and PSA options (with potential risks and benefits of testing vs not testing) were discussed along with recent recs/guidelines. -USPSTF grade A and B recommendations reviewed with patient; age-appropriate recommendations, preventive care, screening tests, etc discussed and encouraged; healthy living encouraged; see AVS for patient education given to patient -Discussed importance of 150 minutes of physical activity weekly, eat two servings of fish weekly, eat one serving of tree nuts ( cashews, pistachios, pecans, almonds.SABRA) every other day, eat 6 servings of fruit/vegetables daily and drink plenty of water and avoid sweet beverages.  -Reviewed Health Maintenance: yes

## 2023-12-19 NOTE — Telephone Encounter (Signed)
 Copied from CRM [id number]. Topic: General - Other >> Dec 19, 2023 10:48 AM Elle L wrote: Reason for CRM: Roxie Mano called to advise that they need a new FL2 form as it listed that the patient takes Tylenol  when he does not take Tylenol .

## 2023-12-20 ENCOUNTER — Ambulatory Visit: Payer: Self-pay | Admitting: Nurse Practitioner

## 2023-12-20 NOTE — Telephone Encounter (Signed)
 Roxie called back to check on the status of the updated FL2 form and Sherrilyn got on the phone and asked to transfer Kristie to her which I did.

## 2023-12-27 ENCOUNTER — Telehealth: Payer: Self-pay | Admitting: Nurse Practitioner

## 2023-12-27 NOTE — Telephone Encounter (Unsigned)
 Copied from CRM [id number]. Topic: Medical Record Request - Other >> Dec 27, 2023  9:07 AM Powell HERO wrote: Reason for CRM: Reason for CRM: Roxie calling to see if she can get a copy of patients FL2 faxed over to her ASAP Fax: [phone].

## 2023-12-28 NOTE — Telephone Encounter (Signed)
 Re-faxed.

## 2024-01-02 NOTE — Addendum Note (Signed)
 Addended by: GARETH MLISS FALCON on: 01/02/2024 07:18 AM   Modules accepted: Level of Service

## 2024-11-13 ENCOUNTER — Ambulatory Visit

## 2024-12-22 ENCOUNTER — Encounter: Admitting: Nurse Practitioner
